# Patient Record
Sex: Male | Born: 1945 | Hispanic: Yes | Marital: Married | Smoking: Former smoker
Health system: Southern US, Community
[De-identification: ages and names within clinical notes are randomized; demographics above are authoritative.]

## PROBLEM LIST (undated history)

## (undated) DIAGNOSIS — I10 Essential (primary) hypertension: Secondary | ICD-10-CM

## (undated) DIAGNOSIS — I7101 Dissection of thoracic aorta: Secondary | ICD-10-CM

## (undated) DIAGNOSIS — I729 Aneurysm of unspecified site: Secondary | ICD-10-CM

## (undated) DIAGNOSIS — R079 Chest pain, unspecified: Secondary | ICD-10-CM

## (undated) DIAGNOSIS — G8929 Other chronic pain: Secondary | ICD-10-CM

## (undated) DIAGNOSIS — R0602 Shortness of breath: Secondary | ICD-10-CM

## (undated) DIAGNOSIS — M47817 Spondylosis without myelopathy or radiculopathy, lumbosacral region: Secondary | ICD-10-CM

## (undated) DIAGNOSIS — M549 Dorsalgia, unspecified: Secondary | ICD-10-CM

## (undated) HISTORY — DX: Spondylosis without myelopathy or radiculopathy, lumbosacral region: M47.817

## (undated) HISTORY — DX: Other chronic pain: G89.29

## (undated) HISTORY — DX: Dorsalgia, unspecified: M54.9

## (undated) HISTORY — DX: Shortness of breath: R06.02

## (undated) HISTORY — DX: Dissection of thoracic aorta: I71.01

## (undated) HISTORY — DX: Chest pain, unspecified: R07.9

## (undated) HISTORY — DX: Aneurysm of unspecified site: I72.9

## (undated) HISTORY — DX: Essential (primary) hypertension: I10

---

## 2007-06-10 DIAGNOSIS — I7101 Dissection of ascending aorta: Secondary | ICD-10-CM

## 2007-06-10 HISTORY — PX: OTHER SURGICAL HISTORY: SHX169

## 2007-06-10 HISTORY — DX: Dissection of ascending aorta: I71.010

## 2007-06-10 HISTORY — DX: Dissection of thoracic aorta: I71.01

## 2007-06-11 ENCOUNTER — Ambulatory Visit: Payer: Self-pay | Admitting: Thoracic Surgery (Cardiothoracic Vascular Surgery)

## 2007-06-11 ENCOUNTER — Encounter: Payer: Self-pay | Admitting: Thoracic Surgery (Cardiothoracic Vascular Surgery)

## 2007-06-11 ENCOUNTER — Inpatient Hospital Stay (HOSPITAL_COMMUNITY): Admission: EM | Admit: 2007-06-11 | Discharge: 2007-06-16 | Payer: Self-pay | Admitting: Emergency Medicine

## 2007-06-21 ENCOUNTER — Inpatient Hospital Stay (HOSPITAL_COMMUNITY): Admission: EM | Admit: 2007-06-21 | Discharge: 2007-06-26 | Payer: Self-pay | Admitting: Emergency Medicine

## 2007-06-21 ENCOUNTER — Ambulatory Visit: Payer: Self-pay | Admitting: Cardiovascular Disease

## 2007-06-22 ENCOUNTER — Encounter: Payer: Self-pay | Admitting: Cardiothoracic Surgery

## 2007-06-22 ENCOUNTER — Encounter: Payer: Self-pay | Admitting: Thoracic Surgery (Cardiothoracic Vascular Surgery)

## 2007-06-22 HISTORY — PX: SUBXYPHOID PERICARDIAL WINDOW: SHX5075

## 2007-07-09 ENCOUNTER — Encounter
Admission: RE | Admit: 2007-07-09 | Discharge: 2007-07-09 | Payer: Self-pay | Admitting: Thoracic Surgery (Cardiothoracic Vascular Surgery)

## 2007-07-09 ENCOUNTER — Ambulatory Visit: Payer: Self-pay | Admitting: Thoracic Surgery (Cardiothoracic Vascular Surgery)

## 2007-10-01 ENCOUNTER — Encounter
Admission: RE | Admit: 2007-10-01 | Discharge: 2007-10-01 | Payer: Self-pay | Admitting: Thoracic Surgery (Cardiothoracic Vascular Surgery)

## 2007-10-01 ENCOUNTER — Ambulatory Visit: Payer: Self-pay | Admitting: Thoracic Surgery (Cardiothoracic Vascular Surgery)

## 2008-01-30 ENCOUNTER — Encounter
Admission: RE | Admit: 2008-01-30 | Discharge: 2008-01-30 | Payer: Self-pay | Admitting: Thoracic Surgery (Cardiothoracic Vascular Surgery)

## 2008-02-04 ENCOUNTER — Ambulatory Visit: Payer: Self-pay | Admitting: Thoracic Surgery (Cardiothoracic Vascular Surgery)

## 2008-08-11 ENCOUNTER — Encounter
Admission: RE | Admit: 2008-08-11 | Discharge: 2008-08-11 | Payer: Self-pay | Admitting: Thoracic Surgery (Cardiothoracic Vascular Surgery)

## 2008-08-11 ENCOUNTER — Ambulatory Visit: Payer: Self-pay | Admitting: Thoracic Surgery (Cardiothoracic Vascular Surgery)

## 2009-02-18 ENCOUNTER — Encounter
Admission: RE | Admit: 2009-02-18 | Discharge: 2009-02-18 | Payer: Self-pay | Admitting: Thoracic Surgery (Cardiothoracic Vascular Surgery)

## 2009-02-18 DIAGNOSIS — I729 Aneurysm of unspecified site: Secondary | ICD-10-CM

## 2009-02-18 HISTORY — DX: Aneurysm of unspecified site: I72.9

## 2009-02-20 ENCOUNTER — Ambulatory Visit: Payer: Self-pay | Admitting: Thoracic Surgery (Cardiothoracic Vascular Surgery)

## 2009-03-24 DIAGNOSIS — I71012 Dissection of descending thoracic aorta: Secondary | ICD-10-CM

## 2009-03-24 DIAGNOSIS — I7101 Dissection of thoracic aorta: Secondary | ICD-10-CM

## 2009-03-24 HISTORY — DX: Dissection of descending thoracic aorta: I71.012

## 2009-03-24 HISTORY — DX: Dissection of thoracic aorta: I71.01

## 2009-03-25 ENCOUNTER — Ambulatory Visit: Payer: Self-pay | Admitting: Cardiology

## 2009-03-25 ENCOUNTER — Inpatient Hospital Stay (HOSPITAL_COMMUNITY): Admission: EM | Admit: 2009-03-25 | Discharge: 2009-04-04 | Payer: Self-pay | Admitting: Emergency Medicine

## 2009-03-25 ENCOUNTER — Ambulatory Visit: Payer: Self-pay | Admitting: Thoracic Surgery (Cardiothoracic Vascular Surgery)

## 2009-03-25 ENCOUNTER — Encounter: Payer: Self-pay | Admitting: Thoracic Surgery (Cardiothoracic Vascular Surgery)

## 2009-05-04 ENCOUNTER — Ambulatory Visit: Payer: Self-pay | Admitting: Thoracic Surgery (Cardiothoracic Vascular Surgery)

## 2009-05-04 ENCOUNTER — Encounter
Admission: RE | Admit: 2009-05-04 | Discharge: 2009-05-04 | Payer: Self-pay | Admitting: Thoracic Surgery (Cardiothoracic Vascular Surgery)

## 2009-08-10 ENCOUNTER — Ambulatory Visit: Payer: Self-pay | Admitting: Thoracic Surgery (Cardiothoracic Vascular Surgery)

## 2009-08-10 ENCOUNTER — Encounter
Admission: RE | Admit: 2009-08-10 | Discharge: 2009-08-10 | Payer: Self-pay | Admitting: Thoracic Surgery (Cardiothoracic Vascular Surgery)

## 2009-10-30 ENCOUNTER — Encounter
Admission: RE | Admit: 2009-10-30 | Discharge: 2010-01-28 | Payer: Self-pay | Source: Home / Self Care | Attending: Physical Medicine & Rehabilitation | Admitting: Physical Medicine & Rehabilitation

## 2009-11-05 ENCOUNTER — Ambulatory Visit: Payer: Self-pay | Admitting: Physical Medicine & Rehabilitation

## 2009-11-09 ENCOUNTER — Encounter
Admission: RE | Admit: 2009-11-09 | Discharge: 2009-11-09 | Payer: Self-pay | Admitting: Thoracic Surgery (Cardiothoracic Vascular Surgery)

## 2009-11-09 ENCOUNTER — Ambulatory Visit: Payer: Self-pay | Admitting: Thoracic Surgery (Cardiothoracic Vascular Surgery)

## 2009-12-10 ENCOUNTER — Ambulatory Visit: Payer: Self-pay | Admitting: Physical Medicine & Rehabilitation

## 2010-02-02 ENCOUNTER — Encounter
Admission: RE | Admit: 2010-02-02 | Discharge: 2010-03-02 | Payer: Self-pay | Source: Home / Self Care | Attending: Physical Medicine & Rehabilitation | Admitting: Physical Medicine & Rehabilitation

## 2010-02-08 ENCOUNTER — Ambulatory Visit
Admission: RE | Admit: 2010-02-08 | Discharge: 2010-02-08 | Payer: Self-pay | Source: Home / Self Care | Attending: Physical Medicine & Rehabilitation | Admitting: Physical Medicine & Rehabilitation

## 2010-02-15 ENCOUNTER — Ambulatory Visit
Admission: RE | Admit: 2010-02-15 | Discharge: 2010-02-15 | Payer: Self-pay | Source: Home / Self Care | Attending: Thoracic Surgery (Cardiothoracic Vascular Surgery) | Admitting: Thoracic Surgery (Cardiothoracic Vascular Surgery)

## 2010-02-15 ENCOUNTER — Encounter
Admission: RE | Admit: 2010-02-15 | Discharge: 2010-02-15 | Payer: Self-pay | Source: Home / Self Care | Attending: Thoracic Surgery (Cardiothoracic Vascular Surgery) | Admitting: Thoracic Surgery (Cardiothoracic Vascular Surgery)

## 2010-02-21 ENCOUNTER — Encounter: Payer: Self-pay | Admitting: Thoracic Surgery (Cardiothoracic Vascular Surgery)

## 2010-02-22 ENCOUNTER — Encounter: Payer: Self-pay | Admitting: Thoracic Surgery (Cardiothoracic Vascular Surgery)

## 2010-04-05 ENCOUNTER — Encounter: Payer: Self-pay | Admitting: Physical Medicine & Rehabilitation

## 2010-04-05 ENCOUNTER — Encounter: Payer: Worker's Compensation | Attending: Physical Medicine & Rehabilitation

## 2010-04-05 DIAGNOSIS — M545 Low back pain, unspecified: Secondary | ICD-10-CM | POA: Insufficient documentation

## 2010-04-12 ENCOUNTER — Encounter: Payer: Worker's Compensation | Admitting: Physical Medicine & Rehabilitation

## 2010-04-12 DIAGNOSIS — M5380 Other specified dorsopathies, site unspecified: Secondary | ICD-10-CM

## 2010-04-21 LAB — BASIC METABOLIC PANEL
CO2: 27 mEq/L (ref 19–32)
Calcium: 8.2 mg/dL — ABNORMAL LOW (ref 8.4–10.5)
Creatinine, Ser: 1.04 mg/dL (ref 0.4–1.5)
GFR calc Af Amer: >60 mL/min (ref >60)
GFR calc non Af Amer: >60 mL/min (ref >60)
Sodium: 137 mEq/L (ref 135–145)

## 2010-04-21 LAB — CARDIAC PANEL(CRET KIN+CKTOT+MB+TROPI)
CK, MB: 0.9 ng/mL (ref 0.3–4.0)
CK, MB: 1.2 ng/mL (ref 0.3–4.0)
Relative Index: INVALID (ref 0.0–2.5)
Relative Index: INVALID (ref 0.0–2.5)
Total CK: 103 U/L (ref 7–232)
Troponin I: 0.01 ng/mL (ref 0.00–0.06)
Troponin I: 0.02 ng/mL (ref 0.00–0.06)

## 2010-04-21 LAB — CBC
Hemoglobin: 11 g/dL — ABNORMAL LOW (ref 13.0–17.0)
MCHC: 34 g/dL (ref 30.0–36.0)
Platelets: 175 10*3/uL (ref 150–400)
RBC: 3.1 MIL/uL — ABNORMAL LOW (ref 4.22–5.81)
RDW: 14.9 % (ref 11.5–15.5)
WBC: 8.7 10*3/uL (ref 4.0–10.5)

## 2010-04-21 LAB — POCT I-STAT, CHEM 8
Glucose, Bld: 133 mg/dL — ABNORMAL HIGH (ref 70–99)
HCT: 43 % (ref 39.0–52.0)
Hemoglobin: 14.6 g/dL (ref 13.0–17.0)
Potassium: 4.4 mEq/L (ref 3.5–5.1)
Sodium: 139 mEq/L (ref 135–145)
TCO2: 22 mmol/L (ref 0–100)

## 2010-04-21 LAB — DIFFERENTIAL
Basophils Absolute: 0 10*3/uL (ref 0.0–0.1)
Basophils Relative: 0 % (ref 0–1)
Lymphocytes Relative: 10 % — ABNORMAL LOW (ref 12–46)
Neutro Abs: 9.6 10*3/uL — ABNORMAL HIGH (ref 1.7–7.7)

## 2010-04-21 LAB — TYPE AND SCREEN

## 2010-04-21 LAB — PROTIME-INR: Prothrombin Time: 13.8 seconds (ref 11.6–15.2)

## 2010-04-21 LAB — POCT CARDIAC MARKERS
CKMB, poc: <1 ng/mL — ABNORMAL LOW (ref 1.0–8.0)
Myoglobin, poc: 79.7 ng/mL (ref 12–200)

## 2010-04-21 LAB — GLUCOSE, CAPILLARY
Glucose-Capillary: 112 mg/dL — ABNORMAL HIGH (ref 70–99)
Glucose-Capillary: 116 mg/dL — ABNORMAL HIGH (ref 70–99)
Glucose-Capillary: 151 mg/dL — ABNORMAL HIGH (ref 70–99)

## 2010-04-21 LAB — MRSA PCR SCREENING: MRSA by PCR: NEGATIVE

## 2010-04-25 LAB — CBC
Hemoglobin: 12.1 g/dL — ABNORMAL LOW (ref 13.0–17.0)
MCHC: 33.9 g/dL (ref 30.0–36.0)
Platelets: 192 10*3/uL (ref 150–400)
RDW: 15.1 % (ref 11.5–15.5)

## 2010-04-25 LAB — BASIC METABOLIC PANEL
BUN: 10 mg/dL (ref 6–23)
CO2: 28 mEq/L (ref 19–32)
Calcium: 8.8 mg/dL (ref 8.4–10.5)
GFR calc non Af Amer: >60 mL/min (ref >60)
Glucose, Bld: 113 mg/dL — ABNORMAL HIGH (ref 70–99)
Sodium: 134 mEq/L — ABNORMAL LOW (ref 135–145)

## 2010-05-13 ENCOUNTER — Encounter: Payer: Worker's Compensation | Attending: Physical Medicine & Rehabilitation

## 2010-05-13 ENCOUNTER — Ambulatory Visit: Payer: Worker's Compensation | Admitting: Physical Medicine & Rehabilitation

## 2010-05-13 DIAGNOSIS — M719 Bursopathy, unspecified: Secondary | ICD-10-CM

## 2010-05-13 DIAGNOSIS — I729 Aneurysm of unspecified site: Secondary | ICD-10-CM

## 2010-05-13 DIAGNOSIS — M67919 Unspecified disorder of synovium and tendon, unspecified shoulder: Secondary | ICD-10-CM

## 2010-05-13 DIAGNOSIS — M47817 Spondylosis without myelopathy or radiculopathy, lumbosacral region: Secondary | ICD-10-CM

## 2010-05-13 DIAGNOSIS — M545 Low back pain, unspecified: Secondary | ICD-10-CM | POA: Insufficient documentation

## 2010-05-13 DIAGNOSIS — Q762 Congenital spondylolisthesis: Secondary | ICD-10-CM

## 2010-05-27 NOTE — Assessment & Plan Note (Signed)
[name] returns today.  CHIEF COMPLAINT:  Back pain.  He also has some pain in the lateral thighs bilaterally with some numbness, usually on the right but now also involving the left.  He underwent bilateral L5 dorsal ramus injections, bilateral L4 and bilateral L3 medial branch blocks under fluoroscopic guidance on April 12, 2010.  He went from 8/10 to 2/10 pain giving him at least 75% relief.  This pain has crept back to about a 7/10.  The last injection lasted about 3 weeks.  The previous one also lasted a couple of weeks. Also less in terms of weeks but now months.  His Oswestry score is 80%, stating he cannot lift or carry anything, although this is also from restrictions placed by his thoracic surgeon. He can only walk with crutches, cane.  His sitting tolerance is 10 minutes, standing tolerance is 10 minutes.  The pain interferes with light duties as well as homemaking chores.  The patient also has bilateral rotator cuff repairs.  REVIEW OF SYSTEMS:  Positive for numbness, trouble walking, spasms, anxiety, blood sugar regulation problems, diarrhea, limb swelling, shortness of breath.  SOCIAL HISTORY:  Lives with his wife.  Nonsmoker and nondrinker.  PHYSICAL EXAMINATION:  VITAL SIGNS:  Blood pressure 124/50, pulse 66, respirations 18, and O2 saturation 97% on room air. MUSCULOSKELETAL:  Bilateral upper extremities have abduction to 90 degrees forward flexion and abduction.  His motor strength in the upper extremity is 3-/5 at deltoid, otherwise 4/5 biceps, triceps, grip.  In the lower extremity, he is 4/5 hip flexion/extension, ankle dorsiflexor, and limited by lower extremity pain.  His sensation is intact to light touch.  Straight leg raising test is negative.  In terms of radicular pain, he does have some pain in the back area.  His hip, knee, and ankle range of motion is good.  IMPRESSION:  Lumbar spondylolisthesis.  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  He  has had good relief x2 short term with medial branch blocks.  In think he would be a candidate for radiofrequency starting on the right side L3 and L4 medial branch and L5 dorsal ramus and one month later we will do on left side assuming good persistent improvement on the right side.  The patient gave me some paper work to complete in regards to his workers' comp, C4 progress report.  I will see him back for the procedure.     [name] [name], M.D. Electronically Signed    AEK/MedQ D:  05/13/2010 15:15:17  T:  05/14/2010 01:03:23  Job #:  [id number]

## 2010-06-15 NOTE — Assessment & Plan Note (Signed)
OFFICE VISIT   [name] [name], SCHEPERS  DOB:  [birth year]                                        May 04, 2009  CHART #:  [id number]   HISTORY OF PRESENT ILLNESS:  The patient returns for followup of his  aortic dissection.  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  A followup CT angiogram performed in January of this  year revealed new small areas along the proximal aortic root consistent  with small false aneurysms at the site of the patient's original  proximal anastomosis.  At that time, the patient was smoking and his  blood pressure was not under good control.  On March 24, 2009, the  patient developed new acute type B aortic dissection.  He was  hospitalized for approximately 10 days and treated with blood pressure  control that initially required invasive monitoring.  He stabilized and  ultimately was discharged home.  He returns to our office today for  further followup.  He reports that since hospital discharge, he has  continued to do fairly well.  He still has intermittent episodes of pain  across his back and shoulders and chest that seems potentially to be  related to his aortic dissection.  He has obtained a blood pressure  monitoring system at home and has been keeping close check on his blood  pressure at least 3 times every day.  He states that his blood pressure  typically measures less than 110 systolic, occasionally getting as high  as 120 or 125 at the most.  He has done well with this.  He has also  continued to abstain from any tobacco use.  He states that he feels  tired, but otherwise is doing well.  He has not had any shortness of  breath.  The remainder of his review of systems is unremarkable.  The  remainder of his past medical history is unchanged.   CURRENT MEDICATIONS:  1. Labetalol 800 mg p.o. twice daily.  2. Carisoprodol 350 mg  daily at bedtime.  3. Norvasc 10 mg twice daily.  4. Colace 200 mg daily.  5. Ultram as needed for pain.  6. Oxycodone as needed for severe pain.   PHYSICAL EXAMINATION:  Notable for well-appearing male with blood  pressure 114/64, pulse 66, and oxygen saturation 95% on room air.  Auscultation of the chest demonstrates clear breath sounds which are  symmetrical bilaterally.  No wheezes, rales, or rhonchi noted.  Cardiovascular exam is notable for regular rate and rhythm.  There is a  grade 2-3/6 systolic murmur heard best at the right upper sternal  border.  No diastolic murmurs are noted.  The abdomen is soft,  nondistended, and nontender.  Bowel sounds are present.  The extremities  are warm and well perfused.  Pulses are palpable in both radial artery  positions and both posterior tibial positions.  Rectal and GU exams are  both deferred.  Neurologic examination is grossly nonfocal and  symmetrical throughout.   DIAGNOSTIC TESTS:  CT angiogram of the chest performed earlier today at  [facility] is reviewed.  This demonstrates stable radiographic  appearance of the entire thoracic aorta.  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  There remains type B dissection.  The thoracic aorta  has not increased significantly in size.  There is a small amount of  mural thrombus in the wall of the descending thoracic aorta, which was  not present previously.  No other abnormalities are noted.   IMPRESSION:  The patient appears to be doing much better with respect to  blood pressure control and daily habits.  He continues to abstain from  tobacco abuse.  His thoracic aorta appears radiographically stable.  He  is still having some atypical chest pain, which could be related to his  aorta.   PLAN:  I have reminded the patient to avoid any sort of heavy lifting or  strenuous exertion whatsoever.  He should  avoid straining.  I do think  walking is good and I have encouraged him to continue to remain active  from that standpoint.  He understands how important it is for him to  continue to abstain from any tobacco use, and he also understands how  important it is for him to watch his diet, to avoid salt, and to keep  very close management of his blood pressure.  All of his questions have  been addressed.   We will see the patient back in 3 months with a repeat CT angiogram.   [name], M.D.  Electronically Signed   CHO/MEDQ  D:  05/04/2009  T:  05/05/2009  Job:  [id number]   cc:   Duanne Moron, M.D.

## 2010-06-15 NOTE — Assessment & Plan Note (Signed)
OFFICE VISIT   [name] [name], [name] [name]  DOB:  11-Jul-1945                                        October 01, 2007  CHART #:  [id number]   HISTORY OF PRESENT ILLNESS:  The patient returns for followup and  routine surveillance status post emergency hemiarch repair of acute type  A aortic dissection with resuspension of his native aortic valve on Jun 10, 2007.  He was last seen here in the office on July 09, 2007.  Since  then the patient has done well.  He continues to follow up with Dr.  Bing Matter at [facility] in [place] for management of  hypertension.  He apparently underwent a 2D echocardiogram at their  office and was told that the results were favorable.  We have not seen a  report from that exam yet.  The patient reports that overall he is doing  well from the standpoint of his previous aortic dissection.  He is still  having problems with anxiety.  He and his wife have decided they feel  that his aortic dissection must certainly have developed as a result of  problems related to an on-the-job injuries he sustained in the distant  past.  He has problems with chronic pain in his shoulders and apparently  is contemplating returning to see Dr. Chestine Spore for possible surgical  intervention.  His appetite is good.  His activity level is reasonably  good.  He has very minimal residual soreness in the anterior chest.   REVIEW OF SYSTEMS:  The remainder of his review of systems is  noncontributory.   PAST MEDICAL HISTORY:  The remainder of his past medical history is  unchanged.   CURRENT MEDICATIONS:  Carisoprodol 350 mg p.o. 4 times daily, Ultram as  needed for pain, Advil as needed for pain, Timoptic and Travatan  eyedrops, calcium, vitamin D supplement, and Lexapro.   PHYSICAL EXAMINATION:  GENERAL:  Notable for a well-appearing male.  VITAL SIGNS:  Blood pressure 157/98, pulse 78, and oxygen saturation  96%.  HEENT:  Unrevealing.  CHEST:  The median  sternotomy incision has healed completely and the  sternum is stable on palpation.  Auscultation of the chest reveals clear  breath sounds, which are symmetrical bilaterally.  CARDIOVASCULAR:  Regular rate and rhythm.  No murmurs, rubs, or gallops  are noted.  ABDOMEN:  Soft and nontender.  EXTREMITIES:  Warm and well perfused.  Pulses are palpable.   DIAGNOSTIC TESTS:  CT angiogram performed today at the [facility] is reviewed.  This demonstrates stable radiographic  appearance of the thoracic aorta, status post repair of previous acute  aortic dissection.  The descending thoracic aorta has healed completely  it would appear and there is no residual false lumen.  No other  abnormalities are of any significance are appreciated, although the  final report from the interpreting radiologist is pending at this time.   IMPRESSION:  The patient is doing quite well with respect to his  recovery from his acute aortic dissection.  His CT angiogram performed  today looks good and it appears as though the aorta has healed nicely.  His blood pressure is somewhat elevated, although he is somewhat nervous  about his visit in the office today.   PLAN:  I had counseled the patient how important it is for  him to keep  very close management of his hypertension.  He is concerned about making  a trip to [place].  At this point, there is no reason with respect to  his previous surgery that he could not make the trip.  He is also asked  about possible orthopedic surgery for his shoulders.  I suspect that  within the next few months, he could certainly undergo arthroscopic  procedure if needed and even an open procedure would probably be  reasonable as long as his blood pressure is carefully managed.  All of  his questions have been addressed.   PLAN:  We will have the patient return in 3 months for a followup CT  angiogram for routine surveillance.   [name], M.D.   Electronically Signed   CHO/MEDQ  D:  10/01/2007  T:  10/02/2007  Job:  [id number]   cc:   Gypsy Balsam

## 2010-06-15 NOTE — Op Note (Signed)
[name] [name], [name] [name]                  ACCOUNT NO.:  [id number]   MEDICAL RECORD NO.:  [id number]          PATIENT TYPE:  INP   LOCATION:  2310                         FACILITY:  [facility]   PHYSICIAN:  [name], M.D. DATE OF BIRTH:  1945-04-03   DATE OF PROCEDURE:  06/22/2007  DATE OF DISCHARGE:                               OPERATIVE REPORT   PREOPERATIVE DIAGNOSIS:  Pericardial effusion status post repair of  acute aortic dissection.   POSTOPERATIVE DIAGNOSIS:  Pericardial effusion status post repair of  acute aortic dissection.   PROCEDURE:  Subxiphoid pericardial window.   SURGEON:  [name], MD   ASSISTANT:  Theda Belfast, PA   ANESTHESIA:  General.   BRIEF CLINICAL NOTE:  The patient is a 65 year old male who underwent  emergent repair of acute type A aortic dissection 10 days previously.  The patient is readmitted to the hospital with mild dizziness and  confusion.  Chest CT scan reveals normal arch aortogram of the thoracic  aorta, but it also reveals a moderate-to-large-sized pericardial  effusion.  A 2-D echocardiogram confirms the presence of a large  pericardial effusion.  The patient is counseled regarding the  indications, risks, and potential benefits of surgical intervention for  drainage of this pericardial effusion.  Alternative treatment strategies  have been discussed.  He understands and accepts all associated risks  and desires to proceed as described.   OPERATIVE NOTE IN DETAIL:  The patient was brought to the operating room  on the above-mentioned date and placed in the supine position on the  operating table.  Intravenous antibiotics were administered.  Central  venous catheter and radial arterial line were placed for monitoring  purposes.  General endotracheal anesthesia was induced uneventfully  under the care and direction of Dr. Claybon Jabs.  A Foley catheter was  placed.  Baseline transesophageal echocardiogram was performed.   This  demonstrated a large free-flowing pericardial effusion.  There was  normal left ventricular function.  The aortic valve appeared normal.   The patient's chest, abdomen, both groins were prepared and draped in  sterile manner.  The midline incision was made beginning over the  xiphoid process along the previous sternal incision and extending  inferiorly towards the umbilicus over several centimeters.  The incision  was completed through the subcutaneous tissues and the linea alba with  electrocautery.  The left body of the rectus abdominis muscle was  retracted anteriorly and cephalad.  The preperitoneal fascia was  retracted inferiorly.  The undersurface of the pericardium was exposed.  The pericardium was incised with electrocautery.  A total of 500 mL of  old dark, thin, bloody fluid was evacuated from the pericardial sac.  A  specimen of the fluid was sent for routine culture and sensitivity.  The  patient's blood pressure was noted to increase 50 mmHg after evacuation  of the effusion.Transesophageal echocardiogram verifies complete  evacuation of the effusion.  A 28-French Blake drain was exited through  separate stab incision and placed through the incision in the  pericardium to drain the pericardial space.  Meticulous hemostasis was  ascertained.  The abdominal fascia was closed in the midline using  interrupted #1 Vicryl sutures.  The subcutaneous tissues were  closed in layers, and the skin was closed with a subcuticular skin  closure.  The patient tolerated the procedure well, was extubated in the  operating room, and transported to recovery room in stable condition.  There were no intraoperative complications.      [name], M.D.  Electronically Signed     CHO/MEDQ  D:  06/22/2007  T:  06/23/2007  Job:  [id number]

## 2010-06-15 NOTE — Consult Note (Signed)
[name] [name], [name] [name]                  ACCOUNT NO.:  [id number]   MEDICAL RECORD NO.:  [id number]          PATIENT TYPE:  INP   LOCATION:  2028                         FACILITY:  [facility]   PHYSICIAN:  Christell Faith, MD   DATE OF BIRTH:  06/17/1945   DATE OF CONSULTATION:  DATE OF DISCHARGE:                                 CONSULTATION   [facility] will be consulting for pericardial effusion.   REFERRING PHYSICIAN:  [name], M.D.   CHIEF COMPLAINT:  Confusion.  Dizziness   HISTORY OF PRESENT ILLNESS:  This is a 65 year old Latino man who is  status post acute type-A aortic dissection repair 10 days ago by Dr.  Cornelius Moras.  He was discharged last week and has been recovering well at home.  However, the patient's sons and wife are concerned because he is having  spells of confusion and is not sleeping well at night for tyhe past few  nights.  The patient states he is not sleeping well because he is used  to lying face down on his chest, but can not do that because of his  recent sternotomy, so he is having trouble getting comfortable in other  positions.  He denies orthopnea or PND.  He admits to very mild dyspnea  with exertion.  He is walking regularly throughout the day without chest  pain or undue dyspnea.  He reports occasional dizziness with standing,  which resolves after standing for a few moments.  He has had no syncope.   PAST MEDICAL HISTORY:  1. On Jun 10, 2007, he presented to the emergency department with      chest pain, diaphoresis, and syncope, was found to have a type A-      aortic dissection complicated by hemopericardium and cardiac      tamponade.  He was taken to the emergency room by Dr. Cornelius Moras, had a      hemiarch repair of the aorta from the sinotubular junction to the      arch at a position just beyond the ligamentum arteriosum.  The      tamponade was relieved with opening of the pericardium.  The      dissection involved the noncoronary sinus of  Valsalva and a small      area around the left main coronary artery.  2. The native aortic valve was resuspended with resolution of aortic      insufficiency after aortic repair.  3. Postop confusion, resolved prior to discharge.  4. Postop anemia, status post multiple transfusions.  5. Postoperative hypertension, severe.  6. Chronic hypertension.  7. Significant degenerative spinal disease.  8. Status post appendectomy.  9. 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   SOCIAL HISTORY:  He lives in [place] with his wife.  He has two sons  who live nearby.  He smoked half-a-pack to pack-a-day prior to his  surgery, but has quit since then.  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   FAMILY HISTORY:  Noncontributory.   ALLERGIES:  None.   MEDICATIONS:  1. Lopressor 25 mg b.i.d.  2. Ultram q. 6h.  3. Vicodin p.r.n.   REVIEW OF SYSTEMS:  Positive for hoarse voice status post endotracheal  intubation, positive for mild chest pain, mild dyspnea on exertion, and  dizziness upon standing, positive for weakness and anxiety.  Otherwise  the balance of 14 systems is reviewed and is negative.   PHYSICAL EXAMINATION:  VITAL SIGNS: Temperature 98.5, pulse 89,  saturation 97% on room air, blood pressure 93/66 while seated, pulsus  paradoxus in the left arm is 16.  The first Korotkoff sound is present  with his systolic pressure of 92 at expiration only and Korotkoff sounds  are present with both inspiration and expiration at a systolic pressure  of 76.  GENERAL: This is a pleasant Latino man in no acute distress.  He is calm  and generally appears well.  HEENT: Pupils equal round and reactive.  Sclerae are clear.  NECK: Supple.  He has a well-healed scar in his right lateral neck,  status post tumor resection.  No JVD.  No carotid bruits.  LUNGS: Clear to auscultation bilaterally  without wheezing or rales.  CARDIAC: Reveals normal rate, regular rhythm.  Heart tones are distant.  There is no rub.  There is no obvious murmur.  ABDOMEN: Soft, nontender,  nondistended.  EXTREMITIES: Revealed 1+ edema bilaterally with intact pulses.  SKIN: There is no skin rash.  The patient is awake, alert and oriented  x3.  He is not overtly confused or delirious on exam.   DIAGNOSTIC TESTS:  Chest CT scan shows a large pericardial effusion  which is increased in size from the last CT scan on Jun 11, 2007; there  is no evidence of recurrent dissection.  EKG shows sinus rhythm with a  rate of 89 with nonspecific ST and T-wave changes and diffusely  decreased T wave voltage compared to prior.  Hemoglobin 9.4, hematocrit  28.3, sodium 135, potassium 4.7, BUN 14, and creatinine 1.   IMPRESSION:  This is a 65 year old Latino man who is postoperative day  #10 status post type A aortic dissection repair.  The dissection was  complicated by hemopericardium and tamponade which was relieved  intraoperatively by opening of the pericardium.  He now presents with a  vague complaints including intermittent episodes of confusion and mild  dizziness upon standing, and he is found to have increased pericardial  effusion on chest CT scan.   PLAN:  I do think this is pericardial fluid as opposed to fat and there  is evidence of increased pericardial pressure with a pulses paradoxus of  16.  Full echocardiogram was offered tonight, but CT surgery team is  comfortable with a echo first thing in the morning.  The patient clearly  looks comfortable at this time.  Would monitor on telemetry with close  attention to heart rate and blood pressure throughout the night.  Would  keep the patient on strict bedrest and discontinue his beta-blocker and  consider gentle hydration.  We will follow up with result of the  echocardiogram first thing in the morning and appreciate the  consultation.      Christell Faith, MD  Electronically Signed     NDL/MEDQ  D:  06/21/2007  T:  06/22/2007  Job:  [id number]

## 2010-06-15 NOTE — Assessment & Plan Note (Signed)
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  He was last seen here in the office on October 01, 2007.  Since then, he has done well.  The patient reports now being essentially  back to his baseline medical status, although he continues to have  problems with chronic bilateral shoulder pain and back pain that he  states dates back to an on-the-job injury that he sustained in the  distant past.  He is contemplating having elective left shoulder surgery  in the near future to further address rotator cuff injury related to  this incident.  He otherwise is doing well.  He states that his blood  pressure has been under good control.  He has not had any chest pain or  chest tightness of significance.  He has not had significant shortness  of breath.  His activity level is back to his baseline.  He has not had  any back pain or chest pain similar to that which occurred at the time  of his presentation last May.  The remainder of his review of systems is  unremarkable.  The remainder of his past medical history is unchanged.   CURRENT MEDICATIONS:  Carisoprodol 350 mg 4 times daily, Ultram tablets  as needed for pain, Advil tablets as needed for pain, Timoptic and  Travatan eye drops, Nexium, vitamin D with calcium supplement, and  Lexapro.   PHYSICAL EXAMINATION:  GENERAL:  Notable for a well-appearing male.  VITAL SIGNS:  Blood pressure 137/94, pulse 90, and oxygen saturation 94%  on room air.  CHEST:  Median sternotomy scar and a right subclavicular scar that have  both healed uneventfully.  The sternum is stable on palpation and  nontender.  Auscultation of the  chest demonstrates clear breath sounds,  which are symmetrical bilaterally.  CARDIOVASCULAR:  Regular rate and  rhythm.  There is a soft grade 2/6 systolic murmur heard best at the  right lower sternal border.  No diastolic murmurs are noted.  ABDOMEN:  Soft, nontender.  EXTREMITIES:  Warm and well perfused.  Pulses are palpable.  The remainder of his physical exam is unremarkable.   DIAGNOSTIC TEST:  CT angiogram of the thoracic and abdominal aorta  performed on January 30, 2008, is reviewed.  This demonstrates stable  radiographic appearance of the thoracic and abdominal aorta.  The  patient has had previous repair of the acute aortic dissection that  involved the ascending thoracic aorta.  The size of the transverse  aortic arch remains stable.  The descending thoracic aorta is normal in  appearance.  No other abnormalities of any significance are noted.   IMPRESSION:  The patient is doing well now more than 6 months following  emergency repair of acute aortic dissection.  His wife does report that  he has gone back to smoking cigarettes.  His blood pressure has been  under reasonably good control, although there may still be some slight  room for improvement.  PLAN:  I have strongly encouraged the patient to quit smoking.  We have  not made any suggestions for changes to his antihypertensive  medications, and we will leave this to Dr. Bing Matter and colleagues for  long-term management.  I do think it would be reasonable for the patient  to go ahead and have his rotator cuff surgery at any point if this is  felt to be clinically indicated.  There are no contraindications to  surgery at this time.  We will plan to see the patient back in 6 months  with another followup CT angiogram.   [name], M.D.  Electronically Signed   CHO/MEDQ  D:  02/04/2008  T:  02/04/2008  Job:  [id number]   cc:   Gypsy Balsam

## 2010-06-15 NOTE — Assessment & Plan Note (Signed)
OFFICE VISIT   [name] [name], [name] [name]  DOB:  02/05/1945                                        February 20, 2009  CHART #:  [id number]   HISTORY OF PRESENT ILLNESS:  The patient returns to the office today for  followup and surveillance status post emergency hemiarch repair of acute  type A aortic dissection with resuspension of his native aortic valve on  Jun 10, 2007.  He was last seen here in the office on August 11, 2008.  He  actually missed several followup appointments over the last 6 weeks, but  he ultimately returned to have his followup CT angiogram earlier this  week.  During the interim period of time, the patient apparently has  also stopped going to see Dr. Bing Matter for long-term attention to  management of his hypertension.  He now no longer has primary care  physician, although he [facility] St[facility] for attention to  his long-term issues related to chronic pain in his back, neck, and  shoulders related to an injury he sustained several years ago.  He has  not been taking his blood pressure medications regularly.  He continues  to smoke.  According to his family, he has gone back into weightlifting  despite the fact that he had been advised not to by me previously.  He  is not walking and exercising aerobically.  He has had some atypical  pains across his chest and shoulders.  Since I last saw him, he has also  had both shoulders operated on for rotator cuff injuries.  He denies any  persistent ongoing chest pain, worrisome for the possibility of pain  related to his aorta, although he does have some intermittent atypical  chest pains and pain in his left shoulder that seems to be more  musculoskeletal in origin.  It is difficult to sort out.  He has not had  any shortness of breath.  He has not had any PND or orthopnea.  He has  not had any exertional chest pain.  The remainder of his review of  systems is unremarkable.  The remainder of his  past medical history is  unchanged.   CURRENT MEDICATIONS:  1. Carisoprodol 350 mg daily at bedtime.  2. Hydrochlorothiazide 25 mg daily (which the patient admits he does      not take regularly).  3. Metoprolol 25 mg daily (which has not been taken recently).  4. Tramadol as needed for pain.  5. Tizanidine 4 mg 2 tablets at bedtime.  6. Prevacid 15 mg daily.   PHYSICAL EXAMINATION:  Notable for a mildly obese Hispanic male who  appears his stated age in no acute distress.  Blood pressure is 157/88,  pulse 76, and oxygen saturation 97% on room air.  Auscultation of the  chest reveals clear breath sounds, which are symmetrical bilaterally.  No wheezes or rhonchi are noted.  Cardiovascular exam is notable for  regular rate and rhythm.  No murmurs, rubs, or gallops are noted.  The  abdomen is soft and nontender.  The extremities are warm and well  perfused.  Pulses are palpable.   DIAGNOSTIC TESTS:  CT angiogram of the chest and abdomen performed  February 18, 2009 is reviewed.  Since the last previous exam, there has  been some interval change proximally.  There is no sign of any new  aortic dissection, but there are 2 or 3 small outpouchings located near  the aortic root that could represent small false aneurysms at the site  of the proximal anastomoses between the aortic root and the aortic  graft.  Both are small, considerably less than 2 cm in diameter.  There  is no sign of progression of aortic dissection distally in the arch  vessels, all remains stable.   IMPRESSION:  I am very concerned that the patient now has 2 small areas  at the proximal aortic root that could represent small false aneurysms  at the side of the proximal anastomosis related to his previous repair  of his acute aortic dissection.  His blood pressure is not under good  control and the patient is doing a very poor job with medical therapy.  He apparently quit going to see Dr. Bing Matter for unclear reasons.   He  continues to smoke.   PLAN:  I had a very blunt conversation with the patient and his family  here in the office.  I explained to them that I am very concerned about  this recent change in the appearance of his thoracic aorta.  There are  no indications for surgery at this time, but he will need to be watched  carefully.  Most importantly, he needs to absolutely quit smoking and  find a way to get his blood pressure under consistent control.  I  explained to him that long-term survival even after a successful repair  of acute type A aortic dissection is not normal by any stretch of the  imagination.  I have reminded him that he should avoid any heavy lifting  or straining that could increase his intrathoracic pressure.  Weightlifting is out of the question.  Smoking is out of the question.  Checking his blood pressure on a daily basis should be part of his  lifelong routine.  I have instructed him to keep a log of his blood  pressure every morning when he gets up and we will contact Dr.  Vanetta Shawl office to try to schedule a followup appointment with him in  the near future to reassess his antihypertensive medications.  We will  also have a followup echo done to make sure that he has not developed  aortic insufficiency given the recent changes on his CT angiogram.  However, he does not have any symptoms of heart failure and I do not  hear a murmur on exam.  We will have the patient return in 3 months for  repeat CT angiogram.   [name], M.D.  Electronically Signed   CHO/MEDQ  D:  02/20/2009  T:  02/21/2009  Job:  [id number]   cc:   Gypsy Balsam

## 2010-06-15 NOTE — H&P (Signed)
[name] [name], [name] [name]                  ACCOUNT NO.:  [id number]   MEDICAL RECORD NO.:  [id number]          PATIENT TYPE:  INP   LOCATION:  2550                         FACILITY:  [facility]   PHYSICIAN:  [name], M.D. DATE OF BIRTH:  [birth year]   DATE OF ADMISSION:  06/10/2007  DATE OF DISCHARGE:                              HISTORY & PHYSICAL   PRESENTING CHIEF COMPLAINT:  Chest pain.   HISTORY OF PRESENT ILLNESS:  The patient is a 65 year old male with  history of hypertension as well as chronic pain related to degenerative  disk disease of the spine and bilateral shoulder pain.  The patient  states that he was in his usual state of health until approximately 2  days ago when he began to develop substernal chest pressure.  This pain  was persistent and unremitting, although initially only mild in  character.  Earlier today, the patient developed sudden onset of much  more severe pain associated with diaphoresis and dizziness.  The patient  presented to the emergency room where he also suffered a brief syncopal  episode.  CT angiogram of the thoracic aorta demonstrates type A aortic  dissection.   PAST MEDICAL HISTORY:  1. Hypertension.  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  3. Chronic pain.  4. Bilateral shoulder pain.   PAST SURGICAL HISTORY:  Appendectomy.   FAMILY HISTORY:  Noncontributory.   SOCIAL HISTORY:  The patient lives in [place] with his wife, and he is  accompanied to the emergency room by his wife and family.  He smokes  about one-half pack of cigarettes per day.  He denies alcohol  consumption.   MEDICATIONS PRIOR TO ADMISSION:  Ultram as needed for pain and some type  of muscle relaxant.   DRUG ALLERGIES:  None known.   PHYSICAL EXAMINATION:  The patient is moderately obese Hispanic male,  who appears his stated age and is very panicky, in acute distress.  Blood pressure is 100/50, pulse 105, and sinus rhythm.  HEENT EXAM:  Grossly unremarkable.  The patient is edentulous.  His head,  neck, and upper extremities are dusky and cyanotic in appearance.  There  is no jugular venous distention.  No carotid bruits are noted.  Auscultation of the chest reveals clear breath sounds anteriorly, which  are symmetrical.  CARDIOVASCULAR EXAM: Reveals regular rate and rhythm.  No murmurs, rubs,  or gallops are noted.  ABDOMEN: Moderately obese, soft, nondistended, and nontender.  EXTREMITIES: Warm and adequately perfused.  Pulses are palpable in the  radial position in both arms and both femoral pulses.  RECTAL AND GU: Exams are both deferred.  NEUROLOGIC EXAMINATION: Nonfocal.  The patient is alert and oriented,  although in some distress.  SKIN: Clean, dry, and healthy appearing throughout.   DIAGNOSTIC TESTS:  CT angiogram of the thoracic aorta demonstrates type  A aortic dissection with hemopericardium.  The aortic dissection  involves the entire aortic root and the ascending thoracic aorta and  appears to stop in the middle of the transverse arch.  The patient has  bovine  arch anatomy.  There are no pleural effusions.  No other  significant abnormalities are appreciated.   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   PLAN:  Emergent surgical repair.  I have discussed matters briefly with  [name], his wife, and son, here in the emergency department.  The  patient's wife is Jehov[employer]'s Witness, but the patient explicitly accept  the notion that he may need transfusion of blood products during the  following surgery.  They understand the eminently life-threatening  nature of this problem without surgical intervention and desire that we  proceed directly to the operating room as planned.      [name], M.D.  Electronically Signed     CHO/MEDQ  D:  06/11/2007  T:  06/11/2007  Job:  [id number]

## 2010-06-15 NOTE — Assessment & Plan Note (Signed)
OFFICE VISIT   [name], [name]  DOB:  1945/02/03                                        July 09, 2007  CHART #:  [id number]   HISTORY OF PRESENT ILLNESS:  [name] returns for followup status post  emergency repair of acute type A aortic dissection.  [name] presented  acutely to the emergency department on Jun 10, 2007.  He underwent  emergency median sternotomy for hemiarch repair of acute type A aortic  dissection with resuspension of his native aortic valve.  His initial  postoperative recovery was remarkably uneventful and he was discharged  home.  He was readmitted to the hospital a week later with shortness of  breath and dizzy spells.  He was found to have a moderate to large size  free-flowing pericardial effusion without tamponade.  He subsequently  underwent subxiphoid pericardial window for drainage of his pericardial  effusion.  He tolerated this well and was again discharged home  uneventfully.  He now returns to the office for further followup.  At  this point, [name] reports that he is doing well, although he still  has his days and nights mixed up in terms of sleeping.  He has had some  anxiety about going to sleep at night, and he has been up most of the  nights with his wife.  He then sleeps most of the day while his wife is  up and around.  He otherwise has no specific complaints.  He has  moderate residual soreness in his chest that continues to gradually  improve.  He has had mild exertional shortness of breath that is stable  and improving.  His appetite is good.  His energy level is good.  He  denies any dizzy spells.  He denies any fevers, chills, or cough.  The  remainder of his review of systems is unremarkable.  Medications remain  unchanged from the time of hospital discharge.   PHYSICAL EXAMINATION:  GENERAL:  Notable for a well-appearing Hispanic  male with blood pressure 110/73, pulse 88 and regular, and oxygen  saturation  94% on room air.  CHEST:  Reveals a median sternotomy incision that is healing nicely.  The small incision in the right deltopectoral groove from axillary  artery cannulation is also healing well.  Auscultation of the chest  demonstrates clear breath sounds which are symmetrical.  No wheezes or  rhonchi are noted.  CARDIOVASCULAR:  Reveals regular rate and rhythm.  There is a slight  systolic ejection murmur along the right sternal border.  No diastolic  murmurs are noted.  ABDOMEN:  Soft, nondistended, and nontender.  Bowel sounds are present.  EXTREMITIES:  Warm and well-perfused.  There is no lower extremity  edema.  Pulses are palpable.  The remainder of his physical exam is unrevealing.   DIAGNOSTIC TEST:  Chest x-ray obtained today at the [facility] is reviewed.  This demonstrates a very small trace left pleural  effusion.  The lung fields are otherwise clear.  There is no pulmonary  edema.  All the sternal wires appear intact.  No other abnormalities are  noted.   IMPRESSION:  Satisfactory progress following recent emergency repair of  acute type A aortic dissection subsequently complicated by postoperative  pericardial effusion that was drained using subxiphoid pericardial  window.  [name] appears to be progressing quite nicely at this point  in time.   PLAN:  I have encouraged [name] to continue to gradually increase his  physical activity with his limitations primarily at this point remaining  that he refrain from heavy lifting or straining or any strenuous use of  his arms or shoulders.  I have encouraged him to increase how much he is  walking, and suggested that he could use stationary bike or treadmill  for increased aerobic physical activity.  We will refer him to [facility], Dr. Gypsy Balsam, for a new patient evaluation, as he  will need a cardiologist for indefinite followup in the future.  I would  favor obtaining a baseline 2D  echocardiogram in 2-3 months.  We will  plan to see [name] back in 3 months' time with a followup CT  angiogram of his thoracic aorta.  All of his questions have been  addressed.   [name], M.D.  Electronically Signed   CHO/MEDQ  D:  07/09/2007  T:  07/10/2007  Job:  [id number]   cc:   Gypsy Balsam

## 2010-06-15 NOTE — Discharge Summary (Signed)
[name] [name], [name] [name]                  ACCOUNT NO.:  [id number]   MEDICAL RECORD NO.:  [id number]          PATIENT TYPE:  INP   LOCATION:  2002                         FACILITY:  [facility]   PHYSICIAN:  [name], M.D. DATE OF BIRTH:  12-May-1945   DATE OF ADMISSION:  06/10/2007  DATE OF DISCHARGE:                               DISCHARGE SUMMARY   FINAL DIAGNOSIS:  Acute type A aortic dissection and pericardial  tamponade.   IN-HOSPITAL DIAGNOSES:  1. Hypertension.  2. Acute blood loss anemia.  3. Postop confusion.   SECONDARY DIAGNOSES:  1. Hypertension.  2. Degenerative disk disease of cervical and lumbar spine.  3. Chronic pain.  4. Bilateral shoulder pain.  5. Status post appendectomy.   IN-HOSPITAL OPERATIONS AND PROCEDURES:  Emergency median sternotomy for  hemi-arch repair of acute type A aortic dissection with resuspension of  aortic valve using right axillary artery, cannulation with total  circulatory arrest, and profound hypothermia.   HISTORY AND PHYSICAL AND HOSPITAL COURSE:  The patient is a 65 year old  Hispanic male with history of hypertension who presents with a 2-day  history of chest pain that progressed to sudden onset of severe  substernal chest pain on the evening of Jun 10, 2007.  The patient  presented to the emergency department right after he suffered a syncopal  episode.  Chest CT demonstrated acute type A aortic dissection with a  hemopericardium.  At the time of presentation, the patient remained  awake and alert, although severely anxious and diaphoretic.  Dr. Cornelius Moras  was consulted.  Dr. Cornelius Moras saw and evaluated the patient.  He discussed  with the patient regarding repair of type A aortic dissection.  He  discussed the risks and benefits with the patient.  The patient  acknowledged understanding and agreed to proceed.  The patient was taken  emergently to the operating room.  For further details of the patient's  past medical history and physical  exam, please see dictated H and P.   The patient was taken to the operating room emergently on Jun 10, 2007,  where he underwent emergency median sternotomy for hemi-arch repair of  acute type A aortic dissection with resuspension of aortic valve using  right axillary artery cannulation with total circulatory arrest with  profound hypothermia.  The patient tolerated this procedure and was  transferred to the intensive care unit.  Postoperatively, the patient  was noted to be hemodynamically stable.  He was able to be extubated  following surgery without difficulties.  Post-extubation, the patient  was noted to be alert and oriented x4, neuro intact.  He was placed on  nasal cannula at 6 L.  His sats were greater than 90%.  Followup chest x-  ray done on postop day #1 was clear with no significant effusions or  atelectasis.  The patient had minimal drainage from chest tubes and  chest tubes were discontinued in the normal fashion.  Repeat chest x-ray  done on postop day #2 remained stable.  The patient was encouraged to  use his incentive spirometer.  He was eventually  able to be weaned off  oxygen sating greater than 90% on room air.  Postoperatively, the  patient noted to have severe hypertension.  He was started on a SNP drip  as well as labetalol IV.  The patient's blood pressure did improve.  He  was weaned off SNP and switched to labetalol p.o.  The patient was  eventually switched from labetalol to Lopressor.  Blood pressure was  well maintained on p.o. Lopressor.  Heart rate remained stable.  He  remained in normal sinus rhythm postoperatively.  The patient did have  significant volume overload and was started on a Lasix drip.  The  patient's blood pressure dropped to 80 systolic and Lasix drip was  stopped on Jun 14, 2007.  He was continued on the p.o. Lopressor as  stated above.  The patient's volume overload remained stable following  discontinuation of the Lasix drip.   Postoperatively, the patient did  have some postoperative confusion.  There was a question whether the  patient had coughing with taking n.p.o.  A feed  study was ordered.  This was done on Jun 14, 2007.  This showed no aspiration or penetration  observed.  There was delayed initiation of swallow, which was felt  secondary to decreased sensation.  They recommended regular diet with  thin liquids, small sips of straws were okay.  The patient was started  on this diet.  During this time, his confusion did improve.  He  tolerated diet well.  No nausea or vomiting noted.  Postoperatively, the  patient did have some mild acute blood loss anemia and he did receive  transfusions immediately postoperatively.  Following these transfusions,  the patient was stabilized.  His hemoglobin and hematocrit remained  stable.  He remained asymptomatic.  Last H&H was 12 and 35.2 on postop  day #4.  Postoperatively, the patient was out of bed and ambulating well  without difficulty.  He was tolerating a regular diet well.   On postop day #4, the patient was afebrile.  Vital Signs:  Heart rate,  normal sinus rhythm in the 80s with blood pressure stable.  Sats were  greater than 90% on room air.  Labs showed a white count of 11.4,  hemoglobin of 12.0, hematocrit 35.2, and platelet count 195.  Sodium of  148, potassium of 3.5, chloride of 99, bicarbonate of 29, BUN of 19, and  creatinine of 1.17.  The patient was in normal sinus rhythm.  Pulmonary  status stable.  Incisions were all clean, dry, intact, and healing well.  The patient was felt to be ready for discharge home in the next 1-2 days  pending he remained stable.   FOLLOWUP APPOINTMENT:  Followup appointment has been arranged with Dr.  Cornelius Moras for June , 2009, at 1:30 p.m..  The patient will need to obtain PA  and lateral chest x-ray 30 minutes prior to this appointment.   ACTIVITIES:  The patient is instructed no driving until he is instructed  to do  so.  No lifting over 10 pounds.  He is told to ambulate 3-4 times  per day, progress as tolerated and continue his breathing exercises.   INCISIONAL CARE:  The patient is told to shower, washing his incisions  using soap and water.  He is to contact the office if he develops any  drainage or opening from any of his incision sites.   DIET:  The patient is educated on diet to be low fat and low salt.  DISCHARGE MEDICATIONS:  1. Aspirin 325 mg daily.  2. Lopressor 25 mg b.i.d.  3. Ultram 50 mg 1-2 tabs q.4-6 h. p.r.n.  4. Vicodin 5/500 mg 1-2 tablets q.4-6 h. p.r.n.     Theda Belfast, PA      [name], M.D.  Electronically Signed   KMD/MEDQ  D:  06/15/2007  T:  06/16/2007  Job:  [id number]

## 2010-06-15 NOTE — Assessment & Plan Note (Signed)
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  He was last seen here in the office on February 04, 2008.  Since then, he has continued to do well.  He has now had both of his  shoulder surgeries completed and has done well with these procedures.  He otherwise is doing fine.  He still has chronic back and hip pain, and  apparently is planning to have surgical correction of the lumbar sacral  degenerative disk disease at some point in the future.  He reports that  his blood pressure has remained under good control.  He has no other  significant complaints.  The remainder of his review of systems is  unremarkable.   PHYSICAL EXAMINATION:  GENERAL:  A well-appearing male.  VITAL SIGNS:  Blood pressure 140/88, pulse 74, and oxygen saturation  96%.  CHEST:  A median sternotomy scar has completely healed and the sternum  is stable.  The small scar in the right deltopectoral groove has also  healed completely.  LUNGS:  Auscultation of the chest reveals clear breath sounds which are  symmetrical.  CARDIOVASCULAR:  Regular rate and rhythm.  No murmurs, rubs, or gallops  are appreciated.  ABDOMEN:  Soft and nontender.  Femoral pulses are palpable.  EXTREMITIES:  Warm and well perfused.  There is no lower extremity  edema.   DIAGNOSTIC TESTS:  CT angiogram of the aorta performed earlier today at  the [facility] is reviewed.  This demonstrates stable  radiographic appearance of the thoracic and abdominal aorta.  There has  been no significant change whatsoever since last exam.  There is no  residual aortic dissection flap at all.  The aorta has not  changed in  size or caliber.   IMPRESSION:  Stable progress, now more than 1 year status post emergency  repair of acute type A aortic dissection.   PLAN:  We will plan a followup CT angiogram for surveillance in 6  months.   [name], M.D.  Electronically Signed   CHO/MEDQ  D:  08/11/2008  T:  08/12/2008  Job:  [id number]   cc:   Gypsy Balsam

## 2010-06-15 NOTE — Assessment & Plan Note (Signed)
OFFICE VISIT   [name], [name]  DOB:  02-28-1945                                        August 10, 2009  CHART #:  [id number]   HISTORY OF PRESENT ILLNESS:  The patient returns for routine followup of  aortic dissection.  He was last seen here in the office on May 04, 2009.  Since then, he has remained clinically stable.  He continues to  abstain from any tobacco use.  He lives a very sedentary lifestyle now  and is not getting out and doing much at all.  He has gained a little  weight.  He has had some increasing lower extremity edema.  He has  chronic pain in his back and shoulders that has not changed.  He has not  had any pain anteriorly in his chest.  The remainder of his review of  systems is entirely stable and unchanged from previously.  The remainder  of his past medical history is unchanged.   CURRENT MEDICATIONS:  1. Norvasc 10 mg every morning, 5 mg every evening.  2. Labetalol 800 mg twice daily.  3. Oxycodone as necessary for pain.  4. Prevacid 15 mg daily.  5. A stool softener.   PHYSICAL EXAMINATION:  Notable for a moderately obese Hispanic male who  appears his stated age in no acute distress.  Blood pressure 127/55,  pulse 60 and regular, oxygen saturation 95% on room air.  Auscultation  of the chest reveals clear breath sounds that are symmetrical  bilaterally.  No wheezes, rales, or rhonchi are noted.  Cardiovascular  exam is notable for regular rate and rhythm.  There is a grade 2/5  systolic murmur heard best along the sternal border.  No diastolic  murmurs are noted.  The abdomen is soft, nontender.  The extremities are  warm and well perfused.  There is mild bilateral lower extremity edema.   DIAGNOSTIC TESTS:  CT angiogram of the chest is performed earlier today  at [facility] and is reviewed and compared with previous  CT angiogram from March 25, 2009.  Today's exam remains unchanged  over the last several  months.  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  These have not gotten any larger in size since February.  The  remainder of the chronic aortic dissection remains unchanged with no  significant alterations and specifically no aneurysmal dilatation of any  segment of the aorta.  No other abnormalities are noted.   IMPRESSION:  The patient remains stable at this time.   PLAN:  We will have the patient return in 3 months with another followup  CT angiogram.  His blood pressure seems to be well controlled at this  time.  All of his questions have been addressed.   [name] [name], M.D.  Electronically Signed   CHO/MEDQ  D:  08/10/2009  T:  08/11/2009  Job:  [id number]   cc:   [name] [name], M.D.

## 2010-06-15 NOTE — Discharge Summary (Signed)
NAMEAARONJAMES, [name]                  ACCOUNT NO.:  [id number]   MEDICAL RECORD NO.:  [id number]          PATIENT TYPE:  INP   LOCATION:  2003                         FACILITY:  [facility]   PHYSICIAN:  [name], M.D. DATE OF BIRTH:  1945-05-06   DATE OF ADMISSION:  06/21/2007  DATE OF DISCHARGE:                               DISCHARGE SUMMARY   PRIMARY ADMITTING DIAGNOSIS:  Dizziness.   ADDITIONAL/DISCHARGE DIAGNOSES:  1. Pericardial effusion, status post repair of acute aortic      dissection.  2. Postoperative hypotension.  3. History of hypertension.  4. History of degenerative cervical and lumbar disk disease.  5. Chronic pain.   PROCEDURE PERFORMED:  Subxiphoid pericardial window.   HISTORY:  The patient is a 65 year old male who recently underwent  emergent repair of an acute type A aortic dissection.  He was discharged  home on Jun 16, 2007, in good condition.  However, following his  discharge, he developed dizziness and confusion and presented to the  emergency department for further evaluation.  CT of the chest was  performed, which showed a normal arch aortogram of the thoracic aorta,  but also showed a moderate to large size pericardial effusion.  Because  of these findings, he was admitted for further workup.  A 2-D  echocardiogram was performed, which showed a large free flowing  pericardial effusion with no overt tamponade, but it was felt that the  effusion was most likely causing his symptoms.  Because of this, Dr.  Cornelius Moras felt he would benefit from a subxiphoid pericardial window.  He  explained the risks, benefits, and alternatives of the surgery to the  patient, and he agreed to proceed.  He was taken to the operating room  on Jun 22, 2007, and underwent pericardial window by Dr. Cornelius Moras.  Approximately, 500 mL of bloody fluid was evacuated.  The patient  tolerated the procedure well and was transferred to the SICU in stable  condition.  Postoperatively, he has  done well.  His chest tube was  removed on postop day #2 after all drainage had ceased.  He was able to  be transferred later that day to the floor.  He has had some hypotension  postoperatively, and his beta-blocker was discontinued, and he was  treated with volume resuscitation.  He continues to run systolics in the  90s-100 range, but appears to be tolerating this well.  He is currently  doing well on the floor.  He is afebrile and all vital signs have been  stable.  He states he is feeling much better and his breathing is  stable.  He is maintaining O2 sats of greater than 90% on room air.  He  is ambulating without problem.  His incisions are all healing well.   His labs have remained stable with hemoglobin 9.7, hematocrit 28.4,  platelets 360, and white count 11.5.  Sodium 139, potassium 4.0, BUN 9,  and creatinine 0.9.  It is felt that since he has remained stable and is  progressing well that he will hopefully be ready for discharge home  within the next 24 hours.   DISCHARGE MEDICATIONS:  As follows.  1. Hydrocodone 5/500 one q.4-6 h. p.r.n. for pain.  2. Ultram 50 mg one q.6 h. p.r.n.  3. Carisoprodol 350 mg one half to one tablet nightly p.r.n.   DISCHARGE INSTRUCTIONS:  He is asked to refrain from driving, heavy  lifting, or strenuous activity.  He may continue ambulating daily using  his incentive spirometer.  He may shower daily and clean his incisions  with soap and water.  He will continue a low-fat, low-sodium diet.   DISCHARGE FOLLOWUP:  He will keep his previously scheduled follow up  appointment with Dr. Cornelius Moras, and have a chest x-ray prior to this  appointment at [facility].  In the interim, if he experiences  problems or has questions, he is asked to contact our office  immediately.       Coral Ceo, P.A.      [name], M.D.  Electronically Signed    GC/MEDQ  D:  06/25/2007  T:  06/25/2007  Job:  [id number]

## 2010-06-15 NOTE — Op Note (Signed)
[name] [name], [name] [name]                  ACCOUNT NO.:  [id number]   MEDICAL RECORD NO.:  [id number]           PATIENT TYPE:   LOCATION:                                 FACILITY:   PHYSICIAN:  [name], M.D. DATE OF BIRTH:  May 03, 1945   DATE OF PROCEDURE:  DATE OF DISCHARGE:                               OPERATIVE REPORT   PREOPERATIVE DIAGNOSIS:  Acute type A aortic dissection with pericardial  tamponade.   POSTOPERATIVE DIAGNOSIS:  Acute type A aortic dissection with  pericardial tamponade.   PROCEDURE:  Emergency median sternotomy for hemi-arch repair of acute  type A aortic dissection with resuspension of aortic valve using right  axillary artery cannulation with total circulatory arrest and profound  hypothermia.   SURGEON:  [name], MD.   ASSISTANTDoree Fudge, PA.   ANESTHESIA:  General.   BRIEF CLINICAL NOTE:  The patient is a 65 year old Hispanic male with  history of hypertension who presents with a 2-day history of chest pain  that progressed to sudden onset of severe substernal chest pain on the  evening of Jun 10, 2007.  The patient presented to the emergency  department where he also suffered a syncopal episode.  Chest CT scan  demonstrates acute type A aortic dissection with hemopericardium.  At  the time of presentation, the patient remained awake and alert although  severely anxious and diaphoretic.  The patient, his wife, and his son  have been counseled regarding the potentially, imminently, life-  threatening nature of his problem and the need to proceed emergently for  surgical repair.  They have been counseled regarding potential for a  variety of complications as well as the potential need for transfusion  of blood products.  All their questions have been addressed, and the  patient was brought promptly directly from the emergency department to  the operating room on the early morning hours of Jun 11, 2007.   OPERATIVE NOTE  DETAIL:  The patient was brought directly from the  emergency room to the operating room on the morning of Jun 11, 2007, and  placed in the supine position on the operating table.  General  endotracheal anesthesia was induced uneventfully under the care and  direction of Dr. Laverle Hobby.  Intravenous antibiotics were  administered.  Central monitoring was established including right  internal jugular catheter with a Swan-Ganz catheter placement.  Bilateral radial arterial lines were placed.  A Foley catheter was  placed.  The patient's neck, chest, abdomen, both groins, and both lower  extremities were prepared and draped in sterile manner.   Baseline transesophageal echocardiogram was performed by Dr. Katrinka Blazing.  This confirms the presence of acute type A aortic dissection.  There was  moderate aortic insufficiency.  There was evidence for pericardial  tamponade, although the patient's blood pressure remained stable  averaging between 100-110 mmHg systolic.   A median sternotomy incision was performed.  Pericardium was opened, and  the patient's blood pressure immediately raised and subsequently  stabilized.  A surgical incision was made in the right delta pectoral  groove approximately 1 fingerbreadth below the right clavicle.  The  right axillary artery was exposed to this incision after retracting the  body of the pectoralis major muscle inferiorly.  Care was taken to  dissect only anterior to the vessels to avoid injury to the subjacent  brachial plexus.  The axillary artery was identified and encircled with  Vesseloop.  The patient was heparinized systemically.  An 8-mm acron  tubular graft was sewn in an end-to-side fashion to the axillary artery  to be utilized for arterial inflow.  This acron graft was then directly  hooked up to the arterial cannula for cardiopulmonary bypass.   The venous cannula was placed through the tip of the right atrium.  A  retrograde cardioplegic  catheter was placed through the right atrium  into the coronary sinus.  Cardiopulmonary bypass was begun and a left  ventricular vent was placed through the right superior pulmonary vein.  Systemic cooling was begun.  Dissection was continued anteriorly and one  can appreciate obvious changes of acute aortic dissection involving the  entire ascending thoracic aorta.  There was clotted blood in the  pericardial sac, but no significant ongoing bleeding.  A temperature  probe was placed in the left ventricular septum.  The innominate vein  was dissected away from associated structures and retracted superiorly  to facilitate exposure of aortic arch.   An aortic cross-clamp was subsequently placed directly across the mid  portion of the ascending thoracic aorta.  Cold blood cardioplegia was  administered retrograde through the coronary sinus catheter.  Iced  saline flush was applied for topical hypothermia.  The initial  cardioplegic arrest and myocardial cooling was notably excellent.  Repeat doses of cardioplegia were administered intermittently throughout  the cross-clamp portion of the operation retrograde through the coronary  sinus catheter to maintain left ventricular septal temperature below 15  degree centigrade.   Ascending thoracic aorta was transected just below the cross-clamp.  There was an obvious acute aortic dissection.  The aorta was dissected  retrograde towards the aortic root.  The dissection continued down into  the aortic root, particularly in the noncoronary sinus of Valsalva.  The  dissection also continued down around portion of the left main coronary  artery.  The aorta was trimmed back to a level of the sinotubular  junction.  The aortic valve was tricuspid and otherwise normal in  appearance with insufficiency caused secondary to the dissection itself.  There were no fenestration the valve and no other abnormalities  appreciated.  Clotted blood was evacuated from  the dissected portion of  the aortic root.  The aortic valve was resuspended using 4-0 Prolene  pledgeted sutures placed at the apex of each of the 3 commissures of the  aortic valve.  The dissected wall of the aortic root is now  reapproximated using BioGlue.  Sinotubular junction measures 28 mm in  diameter.  A 28-mm Hemashield straight aortic graft was sewn in an end-  to-end fashion to the sinotubular junction of the aortic root.  This was  performed using interrupted 2-0 Ethibond horizontal mattress pledgeted  sutures.  The proximal anastomosis was reinforced with BioGlue.   The patient was administered 1 g Solu-Medrol intravenously and high-dose  sodium Pentothal.  Core body temperature was maintained 18 degrees  centigrade for approximately 30 minutes prior to commencing total  circulatory arrest.  At this point, the patient was placed in steep  Trendelenburg position and the head was packed in ice.  Cardiopulmonary  bypass was discontinued, and the patient's blood volume was  exsanguinated into the cardiotomy reservoir.  The aortic cross-clamp was  removed.  The distal extent of the aortic dissection and the aortic arch  were inspected and subsequently dissected away from associated  structures.  This dissection extends along the underside of the aortic  arch to a level just beyond the ligamentum arteriosum.  The arch vessels  were spared involvement of the dissection, and as such hemiarch  replacement was felt to be technically feasible.  A cross-clamp is now  replaced across the innominate artery and the origin of the left common  carotid artery.  Partial antegrade perfusion was reinstituted at this  point after total circulatory arrest time of 7 minutes.   Portion of the 28-mm aortic graft was transected in its mid portion and  the distal end was beveled and subsequently sewn in an end-to-end  fashion to the underside of the aortic arch.  Interrupted pledgeted  sutures were  used for the most caudad extent of this anastomosis on the  underside of the aortic arch, and the remainder of the anastomosis was  completed with running 4-0 Prolene suture.  The anastomosis was  reinforced with BioGlue.  A cross-clamp was now placed across this  distal aortic graft after evacuation of all air through the aortic root.  The cross-clamp across the innominate artery and carotid artery is now  removed, and full antegrade perfusion was reinstituted.  The distal  anastomosis was inspected for hemostasis.  Total elapsed time with  partial antegrade perfusion was 50 minutes.   The 2 Hemashield grafts are now beveled and trimmed to appropriate  length and sewn together in an end-to-end fashion in the mid portion of  the ascending thoracic aorta.  During this time,. rewarming was begun.  One final dose of warm retrograde hot shot cardioplegia was  administered.  Thermal cautery was utilized to create a small hole in  the ascending thoracic aortic graft to evacuate any residual air.  The  aortic cross-clamp was now removed after total cross-clamp time of 133  minutes, including 50 minutes of partial antegrade perfusion and 7  minutes total circulatory arrest.   The entire graft was carefully inspected for meticulous hemostasis.  An  epicardial pacing wire was fixed to the right ventricular free wall into  the right atrial appendage.  The lungs were ventilated and rewarming was  continued for an extended period of time.  Ultimately, normal sinus  rhythm resumed spontaneously without need for cardioversion.  The  patient was rewarmed to 37 degrees centigrade temperature.  At this  juncture, the patient was weaned from cardiopulmonary bypass without  difficulty.  The patient's rhythm at separation from bypass was normal  sinus rhythm.  No inotropic support was required.  Total cardiopulmonary  bypass time at the time of the operation was 222 minutes.   Follow up transesophageal  echocardiogram after separation from bypass  demonstrates normal left ventricular function.  The aortic valve appears  to be functioning normally with no aortic insufficiency.  There was no  sign of residual air and no other abnormalities were noted.   The venous cannula was removed uneventfully.  Protamine was administered  to reverse the anticoagulation.  The right axillary artery graft was  transected within 6 mm of the anastomosis on the axillary artery itself  and this small stump of graft was oversewn with Prolene suture.   The mediastinum was irrigated with saline solution  containing  vancomycin.  Meticulous surgical hemostasis was ascertained.  There was  moderate coagulopathy.  The patient was transfused 2 units fresh frozen  plasma and 2 packs of platelets for thrombocytopenia and coagulopathy.  No packed red blood cells were required.   The right subclavian incision was closed in multiple layers and the skin  was closed with subcuticular skin closure.  The mediastinum was drained  using 2 chest tubes exited through separate stab incisions inferiorly.  The pericardium and soft tissues anterior to the aortic graft were  reapproximated loosely and the sternum was closed with double-strength  sternal wire.  The soft tissues anterior to the sternum were closed in  multiple layers, and the skin was closed with a running subcuticular  skin closure.   The patient tolerated the procedure well and was transported to the  Surgical Intensive Care Unit in stable condition.  There were no  intraoperative complications.  All sponge, instrument, and needle counts  were verified correct at completion of the operation.      [name], M.D.  Electronically Signed     CHO/MEDQ  D:  06/11/2007  T:  06/11/2007  Job:  [id number]

## 2010-06-15 NOTE — Assessment & Plan Note (Signed)
OFFICE VISIT   [name] [name], [name] [name]  DOB:  07-Jul-1945                                        November 09, 2009  CHART #:  [id number]   ADDENDUM.   Followup CT angiogram of the chest and abdomen for the patient is  reviewed.  This reveals essentially stable radiographic appearance of  his entire ascending thoracic, descending thoracic, and abdominal aorta.  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  There does not  appear to be any significant change in the radiographic appearance of  this over the past 3 months.  The aortic dissection involving the  descending thoracic and abdominal aorta also remains stable with no  significant aneurysmal enlargement of the remaining aorta.  No new  abnormalities are noted.   [name], M.D.  Electronically Signed   CHO/MEDQ  D:  11/09/2009  T:  11/10/2009  Job:  [id number]   cc:   Duanne Moron, M.D.  Erick Colace, M.D.

## 2010-06-15 NOTE — Assessment & Plan Note (Signed)
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  He was last seen here in  the office November 09, 2009.  Since then, it sounds as though he has  remained clinically stable.  The patient has a long history of chronic  physical complaints that are difficult to sort out.  He has chronic  chest pain, back pain, shortness of breath and anxiety.  I spent in  excess of 20 minutes with the patient and his family again in the office  today.  I cannot appreciate any difference in the complaints in  comparison with previous exams.  The chest pain he describes seems to be  positional.  The back pain seems to come and go sporadically.  It  certainly could be related to his aortic dissection, although could also  be related to degenerative disease of his spine as well as his history  of trauma in the past.  It sounds as though he is doing better job with  maintaining his blood pressure under control.  The remainder of his  review of systems are unchanged from previously.  The remainder of his  past medical history is unchanged.   Current medications are not available for review.   PHYSICAL EXAMINATION:  General:  Notable for well-appearing, moderately  obese, [place] male with blood pressure 130/66, pulse 61, oxygen  saturation 96% on room air.  Chest:  Auscultation of the chest reveals  clear breath sounds.  Breath sounds are symmetrical.  No wheezes, rales  or rhonchi are noted.  Cardiovascular:  Notable for regular rate and  rhythm.  No murmurs, rubs or gallops are noted.  Abdomen:  Soft,  nontender.  Extremities:  Warm and well-perfused.   DIAGNOSTIC TEST:  CT angiogram of the chest, abdomen and pelvis  performed earlier today is reviewed.  This demonstrates no  significant  change in comparison with the CT angiogram performed on October 2011.  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   IMPRESSION:  The patient's chronic aortic dissection involving the  descending thoracic and abdominal aorta remains radiographically stable.  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  I do not appreciate any  significant change in his long history of multiple physical complaints.  His blood pressure seems to be under reasonably good control.   PLAN:  We will plan to see the patient back for further followup and  repeat CT angiogram in 6 months.   [name], M.D.  Electronically Signed   CHO/MEDQ  D:  02/15/2010  T:  02/16/2010  Job:  [id number]   cc:   Durenda Hurt, M.D.  Baltazar Apo, M.D.

## 2010-06-15 NOTE — Assessment & Plan Note (Signed)
OFFICE VISIT   [name] [name], [name] [name]  DOB:  [birth year]                                        November 09, 2009  CHART #:  [id number]   HISTORY OF PRESENT ILLNESS:  The patient returns for followup of his  aortic dissection.  He originally presented with an acute type A aortic  dissection complicated by pericardial tamponade in May 2009.  He  underwent emergency hemiarch repair of his acute aortic dissection with  resuspension of the native aortic valve.  He initially did well.  He had  some problems with compliance with medical therapy as well as difficulty  with long-term attention to management of severe hypertension.  He later  developed type B aortic dissection involving the remaining descending  thoracic and abdominal aorta, which was not originally involved at the  time of his original presentation in 2009.  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  He has chronic pain that presumably is at least partially  related to his chronic dissection involving the descending aorta.  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  He returns to the office today for routine followup.  Overall, the  patient remains essentially stable.  He still describes a constant like  pain involving his upper and mid back.  In addition, he has occasional  paroxysms of severe pinching like chest pain located in the mid  anterior chest.  This pain is not related to physical activity and seems  to come and go sporadically.  According to the patient and his wife,  these episodes of pain are usually brief, lasting only 2 or 3 minutes at  most.  They are not associated with shortness of  breath.  He has never  had any tachy palpitations or dizzy spells.  He has not had any problems  with resting shortness of breath, PND, orthopnea, or lower extremity  edema.  Overall, his symptoms remained completely stable.  The remainder  of his past medical history is unchanged.   CURRENT MEDICATIONS:  1. Prevacid.  2. Norvasc 10 mg twice daily.  3. Colace.  4. Labetalol 800 mg twice daily.  5. Metaxalone 800 mg 3 times daily.   PHYSICAL EXAMINATION:  Notable for well-appearing male with blood  pressure 113/60, pulse 59 and regular, and oxygen saturation 94% on room  air.  Examination of the chest reveals clear breath sounds that are  symmetrical bilaterally.  No wheezes, rales, or rhonchi are noted.  Cardiovascular exam is notable for regular rate and rhythm.  No murmurs,  rubs, or gallops are appreciated.  The abdomen is soft and nontender.  Bowel sounds are present.  The extremities are warm and well perfused.  There is no lower extremity edema.   IMPRESSION:  The patient remains stable from a clinical standpoint.  He  suffers from chronic pain that presumably is at least partially related  to his chronic aortic dissection involving the descending thoracic and  abdominal aorta.  He also has intermittent paroxysms of epigastric  chest  pain of unclear etiology.  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  Followup CTA of the  thoracic and abdominal aorta is performed today and remains pending at  this time.  The patient's blood pressure appears to be under excellent  control at this time.  He has recently [facility] help with long-term attention to pain  relief.   PLAN:  We will follow up on CT angiogram performed today at the  [facility].  Presuming that there are not any substantial  changes at this time, we will continue to plan to follow up with  further  surveillance every 3 months for the time being.  Next time, we will  obtain a 2-D echo to evaluate left ventricular function in the presence  or absence of any significant aortic insufficiency.  [name] [name] and his  wife understand that there remains a risk of sudden death.  They also  understand that repeat surgical intervention to treat either the false  aneurysm or the descending thoracic aortic dissection would come with  high risk.  They have been reminded that he should refrain from any sort  of strenuous physical activity.   [name] Decent. Cornelius [name], M.D.  Electronically Signed   CHO/MEDQ  D:  11/09/2009  T:  11/10/2009  Job:  [id number]   cc:   Durenda Hurt, M.D.  Baltazar Apo, M.D.

## 2010-07-21 ENCOUNTER — Other Ambulatory Visit: Payer: Self-pay | Admitting: Thoracic Surgery (Cardiothoracic Vascular Surgery)

## 2010-07-21 DIAGNOSIS — I7100 Dissection of unspecified site of aorta: Secondary | ICD-10-CM

## 2010-07-21 DIAGNOSIS — I729 Aneurysm of unspecified site: Secondary | ICD-10-CM

## 2010-08-16 ENCOUNTER — Ambulatory Visit: Payer: Self-pay | Admitting: Thoracic Surgery (Cardiothoracic Vascular Surgery)

## 2010-08-16 ENCOUNTER — Inpatient Hospital Stay: Admission: RE | Admit: 2010-08-16 | Payer: Self-pay | Source: Ambulatory Visit

## 2010-10-27 LAB — BLOOD GAS, ARTERIAL
Bicarbonate: 20.8
O2 Content: 4
Patient temperature: 100.1
Patient temperature: 98.6
TCO2: 22
pH, Arterial: 7.347 — ABNORMAL LOW
pH, Arterial: 7.445

## 2010-10-27 LAB — CBC
HCT: 26.8 — ABNORMAL LOW
HCT: 28.3 — ABNORMAL LOW
HCT: 28.4 — ABNORMAL LOW
HCT: 35.2 — ABNORMAL LOW
Hemoglobin: 12 — ABNORMAL LOW
Hemoglobin: 9.2 — ABNORMAL LOW
Hemoglobin: 9.4 — ABNORMAL LOW
Hemoglobin: 9.7 — ABNORMAL LOW
MCHC: 34
MCHC: 34
MCHC: 34.1
MCV: 101.9 — ABNORMAL HIGH
MCV: 102.6 — ABNORMAL HIGH
Platelets: 333
Platelets: 360
RBC: 2.57 — ABNORMAL LOW
RBC: 2.63 — ABNORMAL LOW
RBC: 2.77 — ABNORMAL LOW
RBC: 2.77 — ABNORMAL LOW
RBC: 3.46 — ABNORMAL LOW
RDW: 16.8 — ABNORMAL HIGH
WBC: 11.5 — ABNORMAL HIGH
WBC: 6.5
WBC: 8.1

## 2010-10-27 LAB — COMPREHENSIVE METABOLIC PANEL
ALT: 60 — ABNORMAL HIGH
AST: 27
Albumin: 2.7 — ABNORMAL LOW
Alkaline Phosphatase: 68
BUN: 9
CO2: 30
Calcium: 8.5
Chloride: 98
Creatinine, Ser: 1.09
GFR calc Af Amer: >60
GFR calc non Af Amer: >60
Glucose, Bld: 129 — ABNORMAL HIGH
Potassium: 4
Sodium: 139
Total Bilirubin: 1.3 — ABNORMAL HIGH
Total Protein: 5.6 — ABNORMAL LOW

## 2010-10-27 LAB — BASIC METABOLIC PANEL
CO2: 29
Calcium: 8.5
Calcium: 8.6
Calcium: 8.6
Chloride: 105
Chloride: 99
Creatinine, Ser: 0.96
Creatinine, Ser: 0.99
Creatinine, Ser: 1.17
GFR calc Af Amer: >60
GFR calc Af Amer: >60
GFR calc Af Amer: >60
GFR calc Af Amer: >60
GFR calc non Af Amer: >60
Potassium: 3.5
Potassium: 4.7
Sodium: 135
Sodium: 139

## 2010-10-27 LAB — TYPE AND SCREEN

## 2010-10-27 LAB — BODY FLUID CULTURE: Culture: NO GROWTH

## 2010-10-27 LAB — DIFFERENTIAL
Lymphocytes Relative: 19
Lymphs Abs: 1.6
Monocytes Absolute: 1.2 — ABNORMAL HIGH
Monocytes Relative: 14 — ABNORMAL HIGH
Neutro Abs: 5.6

## 2010-10-27 LAB — APTT: aPTT: 34

## 2010-11-09 ENCOUNTER — Encounter: Payer: Self-pay | Admitting: *Deleted

## 2010-11-10 ENCOUNTER — Encounter: Payer: Self-pay | Admitting: Thoracic Surgery (Cardiothoracic Vascular Surgery)

## 2010-11-10 ENCOUNTER — Ambulatory Visit (INDEPENDENT_AMBULATORY_CARE_PROVIDER_SITE_OTHER): Payer: Medicare Other | Admitting: Thoracic Surgery (Cardiothoracic Vascular Surgery)

## 2010-11-10 VITALS — BP 108/57 | HR 64 | Resp 18 | Ht 66.0 in | Wt 230.0 lb

## 2010-11-10 DIAGNOSIS — I7101 Dissection of thoracic aorta: Secondary | ICD-10-CM | POA: Insufficient documentation

## 2010-11-10 DIAGNOSIS — I729 Aneurysm of unspecified site: Secondary | ICD-10-CM | POA: Insufficient documentation

## 2010-11-10 NOTE — Patient Instructions (Signed)
The patient requests to be sent to [facility] to see Dr. Kizzie Bane for consultation. We have given him copies of his previous operative notes, office notes, and hospital discharge summaries to take with him. He has been instructed to stop at [facility] imaging today to pick up a CD with copies of all of his old previous CT angiograms for review. He will also need to go to [facility] to get a copy of the CT angiogram performed yesterday placed on a CD to take with him for this consultation at [facility].

## 2010-11-10 NOTE — Progress Notes (Signed)
PCP is REDDING Valrie Hart., MD Referring Provider is No ref. provider found  Chief Complaint  Patient presents with  . Follow-up    repair of type A aortic dissection with ct chest/abd/pelvis    HPI:  Patient is a 65 year old [place] male who underwent emergency resection and grafting of the ascending thoracic aorta for acute type A aortic dissection on 06/10/2007. At that time he presented in pericardial tamponade with a DeBakey Type 2 aortic dissection that stopped at the level of the ligamentum arteriosum. He underwent hemi-arch resection and grafting of the ascending thoracic aorta with resuspension of the native aortic valve. He initially did well although he did require subxiphoid pericardial window 2 weeks later for pericardial effusion. Initial followup CT angiogram is looked good. However, the patient was lost to medical followup for some period of time. He went back to smoking and stopped seeing his cardiologist for blood pressure management. He started lifting weights despite consultation to avoid such activity.  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 These were first seen on CT angiogram performed 02/18/2009. On 03/24/2009 the patient presented with an acute type B aortic dissection. At that time the small false aneurysms in the aortic root remained stable radiographically. Since then the patient quit smoking and has done much better job with respect to blood pressure control and care with regards to his physical activity. We have been following him routinely for repeat CT angiogram and he was seen most recently on 02/15/2010. CT angiogram performed at that time remained essentially stable. The patient was scheduled to come back for followup several months ago, but he had problems with his insurance company denying coverage for his followup CT angiograms. Ultimately a followup CT  angiogram was performed yesterday at [facility] in [place]. This revealed interval increase in size of the false aneurysms in the aortic root. The patient has been referred back to our office for followup.   Past Medical History  Diagnosis Date  . Hypertension   . Ascending aortic dissection 06/10/2007    acute type A dissection with pericardial tamponade  . Descending thoracic aortic dissection 03/24/2009    acute type B aortic dissecton  . DJD (degenerative joint disease), lumbosacral   . Chronic back pain   . Chronic chest pain   . SOB (shortness of breath) on exertion   . 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 Repair aortic dissection 06/10/2007    hemiarch replacement of ascending aorta with resuspension of aortic valve  . Subxyphoid pericardial window 06/22/2007    History reviewed. No pertinent family history.  Social History History  Substance Use Topics  . Smoking status: Former Smoker    Quit date: 03/24/2009  . Smokeless tobacco: Never Used  . Alcohol Use: No    Current Outpatient Prescriptions  Medication Sig Dispense Refill  . amLODipine (NORVASC) 10 MG tablet Take 10 mg by mouth daily. In the am       . amLODipine (NORVASC) 5 MG tablet Take 5 mg by mouth daily. At bedtime        . docusate sodium (COLACE) 100 MG capsule Take 200 mg by mouth 1 day or 1 dose.        . labetalol (NORMODYNE) 200 MG tablet Take 200 mg by mouth  2 (two) times daily. Takes 4 tabs bid       . oxycodone (OXY-IR) 5 MG capsule Take 5 mg by mouth every 4 (four) hours as needed. 1 or 2 tabs q4-6hrs prn       . pantoprazole (PROTONIX) 40 MG tablet Take 40 mg by mouth 2 (two) times daily.          No Known Allergies  Review of Systems  Constitutional: Positive for unexpected weight change.       He has gained 20 pounds  HENT: Negative.   Eyes: Negative.   Respiratory:  Positive for apnea and shortness of breath.        Difficulties sleeping has been improved with CPAP for obstructive sleep apnea. The patient has chronic exertional shortness of breath which is stable. He denies productive cough, hemoptysis, wheezing.  Cardiovascular: Positive for chest pain.       The patient has chronic exertional shortness of breath which is stable. He denies resting shortness of breath, PND, orthopnea, syncope. The patient has chronic chest pain which is atypical in nature and not necessarily related to physical exertion.  Gastrointestinal: Negative.   Genitourinary: Negative.   Musculoskeletal: Positive for myalgias, back pain and arthralgias.  Neurological: Negative.   Hematological: Negative.   Psychiatric/Behavioral: Negative.     BP 108/57  Pulse 64  Resp 18  Ht 5\' 6"  (1.676 m)  Wt 230 lb (104.327 kg)  BMI 37.12 kg/m2  SpO2 97% Physical Exam  Vitals reviewed. Constitutional: He is oriented to person, place, and time. He appears well-developed and well-nourished.  HENT:  Head: Normocephalic.  Eyes: Pupils are equal, round, and reactive to light.  Neck: No JVD present.  Cardiovascular: Normal rate, regular rhythm and normal heart sounds.   No murmur heard. Pulmonary/Chest: Effort normal and breath sounds normal. No respiratory distress. He has no wheezes. He has no rales. He exhibits no tenderness.  Abdominal: Soft. He exhibits no mass. There is no tenderness.  Musculoskeletal: He exhibits no edema.  Lymphadenopathy:    He has no cervical adenopathy.  Neurological: He is alert and oriented to person, place, and time.  Skin: Skin is warm and dry.  Psychiatric: He has a normal mood and affect. His behavior is normal. Thought content normal.     Diagnostic Tests:  CT angiogram of the chest abdomen and pelvis performed yesterday at [facility] has been reviewed. This demonstrates interval increase in size of the false aneurysm involving the aortic  root. This false aneurysm now measures between 3 and 4 cm in its greatest dimension, and this is a significant increase in comparison with January of this year. The false aneurysm does not appear to extend to or communicate with the aortic arch or distal anastomosis of the aortic graft. There is a chronic type B aortic dissection that appears to begin from the underside of the aortic arch just beyond the distal tip of the distal anastomosis. This appears essentially stable. The maximum transverse diameter of the dissected descending thoracic aorta is approximately 4 cm. This may have increased slightly in size over the past year. No other significant changes are noted.   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 The patient has chronic atypical chest pain which may or may not be related to these  false aneurysms and/or the presence of his known chronic type B aortic dissection. Options at this time include continued observation versus redo sternotomy for aortic root replacement with or without concomitant debranching of the aortic arch vessels and possible stent grafting of the descending thoracic aortic dissection. With his chronic atypical chest pain and increasing size of the false aneurysms involving the aortic root, I'm concerned that he remains a fairly high-risk for an acute event if something isn't done surgically. However, surgery will come with exceptionally high-risk. Emergent surgical intervention at the time of acute decompensation would likely be futile. As a result, I suspect that the patient should consider high-risk surgery in the near future if he is ever going to have anything done to treat his chronic aortic problem that will ultimately likely cause his demise. He does not have a  murmur on physical exam, but echocardiogram is certainly needed to evaluate the competency of his aortic valve. At surgery I suspect the aortic valve would need to be replaced. Finally, he has never had his coronary artery anatomy imaged and he probably needs either cardiac catheterization or cardiac gated CT scan to image the proximal coronary vessels.   Plan:  I've discussed matters at length with the patient and his wife and son here in the office today. The patient is a difficult historian and his family seems to have an inconsistent ability to understand his underlying medical conditions. Nevertheless, we spent in excess of an hour discussing these issues and options, and at this point in time I feel that they have at least a temporary understanding of the seriousness of this problem. I have offered to admit the patient directly to the hospital today for further diagnostic testing in anticipation of possible high-risk surgery. We also discussed the possibility of consultation with Dr. Kizzie Bane at [facility] because of his experience with complex aortic surgery and because of their technological capabilities to potentially perform stent grafting of the descending thoracic aorta it might be necessary. They understand that referral to [facility] might potentially delay surgical intervention.  They understand that although the patient's problems have been present for quite some time, the patient certainly could become unstable at any moment. All of their questions have been addressed.

## 2011-12-29 IMAGING — CT CT ANGIO CHEST
2 of 8 series · 7 of 30 positions shown · IV contrast ([ID] [id number])
Comparison: 08/11/2008

CTA CHEST

Addendum Begins

I personally called this report to Wartha, Dr. Lawyer Ahmad nurse, at
[DATE] p.m. on 02/18/2009.
Addendum Ends
CLINICAL DATA: Follow-up ascending thoracic aortic dissection.
CT ANGIOGRAPHY CHEST AND ABDOMEN
TECHNIQUE: Multidetector CT imaging of the chest and abdomen was
performed using the standard protocol during bolus administration
of intravenous contrast.  Multiplanar CT image reconstructions
including MIPs were obtained to evaluate the vascular anatomy.
Contrast: 100 ml [id number]

[Series 5: angio · axial · 0.78mm/px · z∈[-332,-54]mm · 4 of 185 slices shown]
[im 37/185  lung]
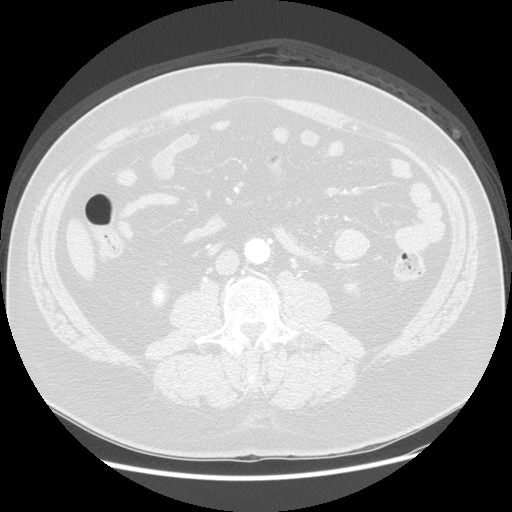
[im 74/185  mediastinal]
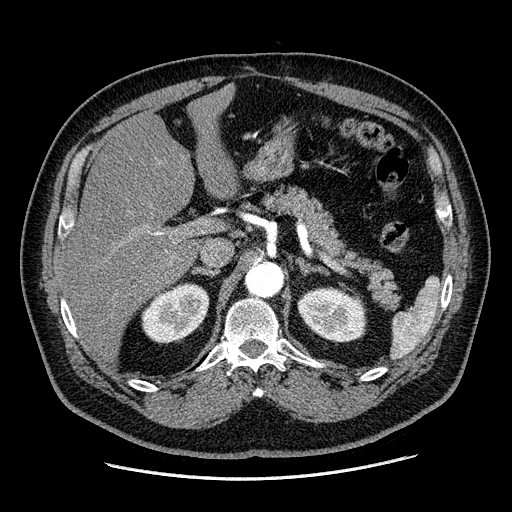
[im 111/185  lung]
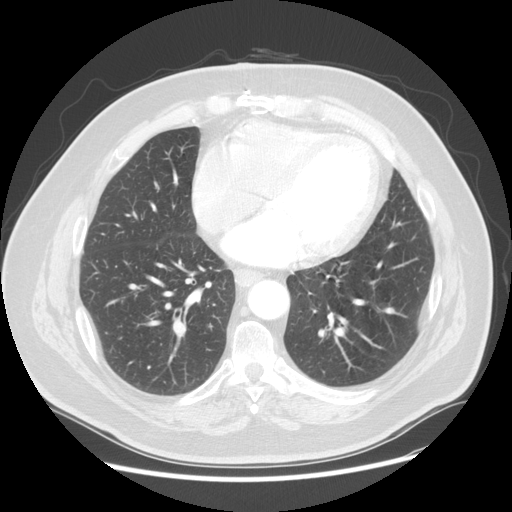
[im 148/185  mediastinal]
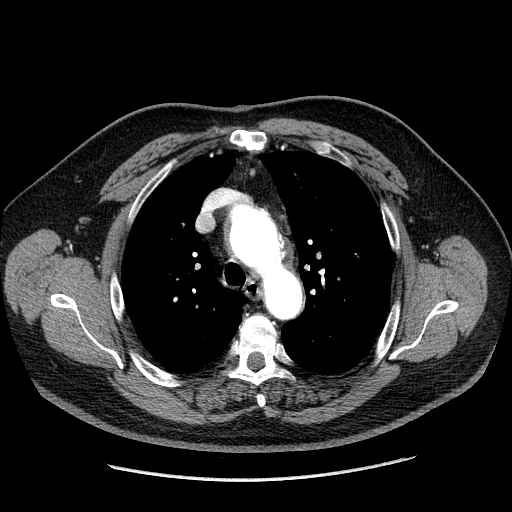

[Series 602: sagittal body · sagittal · 0.90mm/px · 3 of 161 slices shown]
[im 41/161  lung]
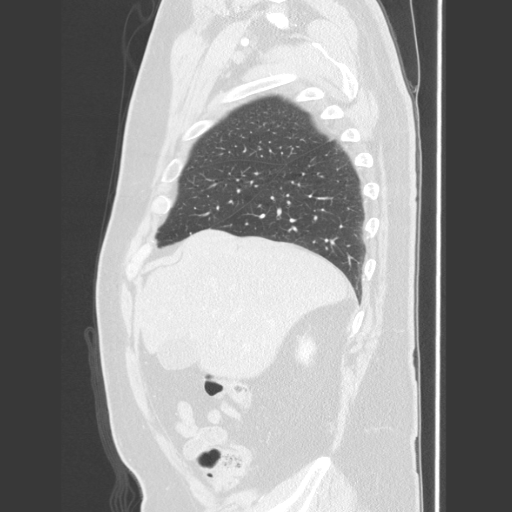
[im 81/161  lung]
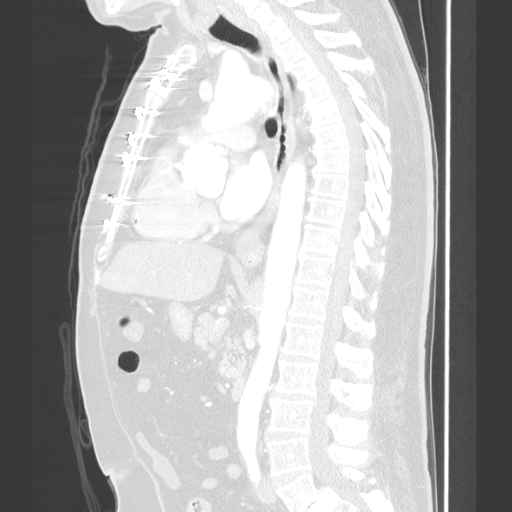
[im 121/161  lung]
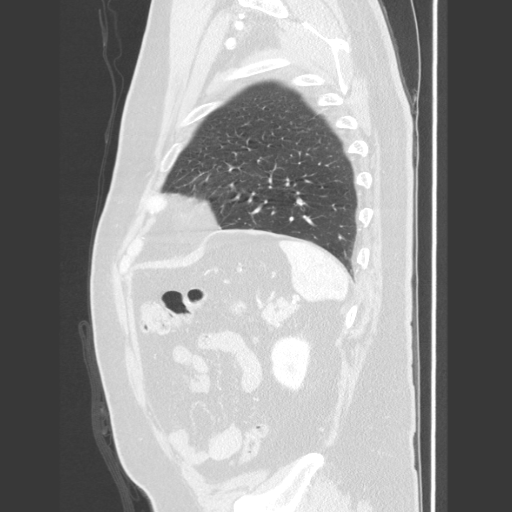

[7 of 30 positions shown; findings below may reference images not displayed]

FINDINGS: No pathologically enlarged mediastinal, hilar or
axillary lymph nodes.  The patient is status post repair of a type
A aortic dissection.  Along the proximal ascending aorta, there are
several outpouchings of contrast, which appear new from baseline
postoperative exam of 06/21/2007.  Maximum diameter of the
ascending aorta is approximate 3.8 cm.  Heart is at the upper
limits of normal in size.  Pulmonary arteries appear mildly
enlarged.  Small hiatal hernia.

Lungs are clear.  No pleural fluid.  Airway is unremarkable.

 Review of the MIP images confirms the above findings.
IMPRESSION: 1.  Postoperative changes of type A aortic dissection.
Outpouchings of contrast along the proximal ascending aorta appear
new from baseline postoperative exam of 06/21/2007, and are
suspicious for pseudoaneurysm formation.
2.  Suspect pulmonary arterial hypertension.

CTA ABDOMEN
FINDINGS: Liver appears decreased in attenuation diffusely.
Liver, gallbladder, and adrenal glands are unremarkable.  Low
attenuation lesions in the kidneys measure up to 2.5 cm on the
right, and are likely cysts.  Spleen, pancreas, stomach and
visualized bowel are otherwise unremarkable.  There are scattered
unenlarged lymph nodes in the abdomen.  No evidence of abdominal
aortic aneurysm.  No free fluid.

There are chronic bilateral L5 pars defects with near grade II
anterolisthesis of L5 on S1.  No worrisome lytic or sclerotic
lesions.

 Review of the MIP images confirms the above findings.
IMPRESSION: 1.  Fatty liver.
2.  Near grade II anterolisthesis of L5 on S1, secondary to chronic
bilateral pars defects.

## 2012-11-02 DIAGNOSIS — I7123 Aneurysm of the descending thoracic aorta, without rupture: Secondary | ICD-10-CM | POA: Insufficient documentation

## 2012-11-02 DIAGNOSIS — I7100 Dissection of unspecified site of aorta: Secondary | ICD-10-CM | POA: Insufficient documentation

## 2012-12-13 DIAGNOSIS — E785 Hyperlipidemia, unspecified: Secondary | ICD-10-CM | POA: Insufficient documentation

## 2012-12-13 DIAGNOSIS — E119 Type 2 diabetes mellitus without complications: Secondary | ICD-10-CM | POA: Insufficient documentation

## 2012-12-13 DIAGNOSIS — R413 Other amnesia: Secondary | ICD-10-CM | POA: Insufficient documentation

## 2012-12-13 DIAGNOSIS — F0280 Dementia in other diseases classified elsewhere without behavioral disturbance: Secondary | ICD-10-CM | POA: Insufficient documentation

## 2012-12-13 DIAGNOSIS — M1990 Unspecified osteoarthritis, unspecified site: Secondary | ICD-10-CM | POA: Insufficient documentation

## 2012-12-13 DIAGNOSIS — F0153 Vascular dementia, unspecified severity, with mood disturbance: Secondary | ICD-10-CM | POA: Insufficient documentation

## 2012-12-13 DIAGNOSIS — I519 Heart disease, unspecified: Secondary | ICD-10-CM | POA: Insufficient documentation

## 2012-12-13 DIAGNOSIS — R519 Headache, unspecified: Secondary | ICD-10-CM | POA: Insufficient documentation

## 2013-12-02 DIAGNOSIS — L910 Hypertrophic scar: Secondary | ICD-10-CM | POA: Insufficient documentation

## 2013-12-02 DIAGNOSIS — R221 Localized swelling, mass and lump, neck: Secondary | ICD-10-CM | POA: Insufficient documentation

## 2013-12-18 DIAGNOSIS — H409 Unspecified glaucoma: Secondary | ICD-10-CM | POA: Insufficient documentation

## 2015-01-19 DIAGNOSIS — Z8679 Personal history of other diseases of the circulatory system: Secondary | ICD-10-CM | POA: Insufficient documentation

## 2015-07-01 DIAGNOSIS — IMO0001 Reserved for inherently not codable concepts without codable children: Secondary | ICD-10-CM | POA: Insufficient documentation

## 2017-02-07 DIAGNOSIS — J9610 Chronic respiratory failure, unspecified whether with hypoxia or hypercapnia: Secondary | ICD-10-CM

## 2017-02-07 DIAGNOSIS — N3001 Acute cystitis with hematuria: Secondary | ICD-10-CM

## 2017-02-07 DIAGNOSIS — N179 Acute kidney failure, unspecified: Secondary | ICD-10-CM

## 2017-02-07 DIAGNOSIS — E1110 Type 2 diabetes mellitus with ketoacidosis without coma: Secondary | ICD-10-CM

## 2017-02-07 DIAGNOSIS — I5031 Acute diastolic (congestive) heart failure: Secondary | ICD-10-CM

## 2017-02-07 DIAGNOSIS — K859 Acute pancreatitis without necrosis or infection, unspecified: Secondary | ICD-10-CM

## 2017-02-07 DIAGNOSIS — E669 Obesity, unspecified: Secondary | ICD-10-CM | POA: Diagnosis not present

## 2017-02-08 DIAGNOSIS — N179 Acute kidney failure, unspecified: Secondary | ICD-10-CM | POA: Diagnosis not present

## 2017-02-08 DIAGNOSIS — I5031 Acute diastolic (congestive) heart failure: Secondary | ICD-10-CM | POA: Diagnosis not present

## 2017-02-08 DIAGNOSIS — K859 Acute pancreatitis without necrosis or infection, unspecified: Secondary | ICD-10-CM | POA: Diagnosis not present

## 2017-02-08 DIAGNOSIS — E1110 Type 2 diabetes mellitus with ketoacidosis without coma: Secondary | ICD-10-CM | POA: Diagnosis not present

## 2017-02-09 DIAGNOSIS — K859 Acute pancreatitis without necrosis or infection, unspecified: Secondary | ICD-10-CM | POA: Diagnosis not present

## 2017-02-09 DIAGNOSIS — N179 Acute kidney failure, unspecified: Secondary | ICD-10-CM | POA: Diagnosis not present

## 2017-02-09 DIAGNOSIS — E1110 Type 2 diabetes mellitus with ketoacidosis without coma: Secondary | ICD-10-CM | POA: Diagnosis not present

## 2017-02-09 DIAGNOSIS — I10 Essential (primary) hypertension: Secondary | ICD-10-CM | POA: Diagnosis not present

## 2017-02-09 DIAGNOSIS — I5031 Acute diastolic (congestive) heart failure: Secondary | ICD-10-CM | POA: Diagnosis not present

## 2017-02-10 DIAGNOSIS — I5031 Acute diastolic (congestive) heart failure: Secondary | ICD-10-CM | POA: Diagnosis not present

## 2017-02-10 DIAGNOSIS — I517 Cardiomegaly: Secondary | ICD-10-CM

## 2017-02-10 DIAGNOSIS — I10 Essential (primary) hypertension: Secondary | ICD-10-CM

## 2017-02-10 DIAGNOSIS — E1110 Type 2 diabetes mellitus with ketoacidosis without coma: Secondary | ICD-10-CM | POA: Diagnosis not present

## 2017-02-10 DIAGNOSIS — K859 Acute pancreatitis without necrosis or infection, unspecified: Secondary | ICD-10-CM | POA: Diagnosis not present

## 2017-02-10 DIAGNOSIS — N179 Acute kidney failure, unspecified: Secondary | ICD-10-CM | POA: Diagnosis not present

## 2017-03-14 DIAGNOSIS — R1032 Left lower quadrant pain: Secondary | ICD-10-CM | POA: Insufficient documentation

## 2020-09-24 DIAGNOSIS — H903 Sensorineural hearing loss, bilateral: Secondary | ICD-10-CM | POA: Diagnosis not present

## 2020-09-24 DIAGNOSIS — H6123 Impacted cerumen, bilateral: Secondary | ICD-10-CM | POA: Diagnosis not present

## 2020-09-24 DIAGNOSIS — J310 Chronic rhinitis: Secondary | ICD-10-CM | POA: Diagnosis not present

## 2020-09-24 DIAGNOSIS — J342 Deviated nasal septum: Secondary | ICD-10-CM | POA: Diagnosis not present

## 2020-09-24 DIAGNOSIS — J343 Hypertrophy of nasal turbinates: Secondary | ICD-10-CM | POA: Diagnosis not present

## 2020-09-29 ENCOUNTER — Ambulatory Visit: Payer: Medicare PPO | Admitting: Allergy & Immunology

## 2020-09-29 DIAGNOSIS — E119 Type 2 diabetes mellitus without complications: Secondary | ICD-10-CM | POA: Diagnosis not present

## 2020-09-29 DIAGNOSIS — F411 Generalized anxiety disorder: Secondary | ICD-10-CM | POA: Diagnosis not present

## 2020-09-29 DIAGNOSIS — J309 Allergic rhinitis, unspecified: Secondary | ICD-10-CM | POA: Diagnosis not present

## 2020-09-29 DIAGNOSIS — K219 Gastro-esophageal reflux disease without esophagitis: Secondary | ICD-10-CM | POA: Diagnosis not present

## 2020-09-29 DIAGNOSIS — I10 Essential (primary) hypertension: Secondary | ICD-10-CM | POA: Diagnosis not present

## 2020-09-29 DIAGNOSIS — Z8679 Personal history of other diseases of the circulatory system: Secondary | ICD-10-CM | POA: Diagnosis not present

## 2020-09-29 DIAGNOSIS — J449 Chronic obstructive pulmonary disease, unspecified: Secondary | ICD-10-CM | POA: Diagnosis not present

## 2020-09-29 DIAGNOSIS — Z952 Presence of prosthetic heart valve: Secondary | ICD-10-CM | POA: Diagnosis not present

## 2020-09-29 DIAGNOSIS — Z7951 Long term (current) use of inhaled steroids: Secondary | ICD-10-CM | POA: Diagnosis not present

## 2020-10-15 ENCOUNTER — Other Ambulatory Visit: Payer: Self-pay

## 2020-10-15 ENCOUNTER — Encounter: Payer: Self-pay | Admitting: Allergy & Immunology

## 2020-10-15 ENCOUNTER — Ambulatory Visit (INDEPENDENT_AMBULATORY_CARE_PROVIDER_SITE_OTHER): Payer: Medicare HMO | Admitting: Allergy & Immunology

## 2020-10-15 VITALS — BP 140/76 | HR 80 | Temp 98.6°F | Resp 16 | Ht 66.0 in | Wt 192.0 lb

## 2020-10-15 DIAGNOSIS — J3089 Other allergic rhinitis: Secondary | ICD-10-CM

## 2020-10-15 DIAGNOSIS — J302 Other seasonal allergic rhinitis: Secondary | ICD-10-CM | POA: Diagnosis not present

## 2020-10-15 DIAGNOSIS — J3489 Other specified disorders of nose and nasal sinuses: Secondary | ICD-10-CM | POA: Diagnosis not present

## 2020-10-15 DIAGNOSIS — R0602 Shortness of breath: Secondary | ICD-10-CM | POA: Diagnosis not present

## 2020-10-15 MED ORDER — TRIAMCINOLONE ACETONIDE 55 MCG/ACT NA AERO: 2.0000 | INHALATION_SPRAY | Freq: Every day | NASAL | 5 refills | Status: DC

## 2020-10-15 MED ORDER — LEVOCETIRIZINE DIHYDROCHLORIDE 5 MG PO TABS: 5.0000 mg | ORAL_TABLET | Freq: Two times a day (BID) | ORAL | 5 refills | Status: AC | PRN
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
## 2020-10-15 NOTE — Patient Instructions (Addendum)
1. Rhinorrhea - Testing today showed: ragweed, trees, molds, cat, and dust mite - Copy of testing results provided. - Consider allergy shots for long term control.  - Stop the current nose spray and start Nasacort one spray per nostril daily (samples provided). - Add on levocetirizine 5 mg daily. - Please collect mucous from your nose (enough to cover the bottom of the container) so we can send it for Beta-2 Transferrin. - This test will help [place] decide whether the runny nose is coming from a cerebrospinal fluid leak or just run of the mill mucous.  - I am doing this because your symptoms started after your nasal surgery.  - Bring the container of mucous to a [facility] tomorrow sometime.   2. Shortness of breath - Lung testing did not look great but this might be your baseline. - Continue with Trelegy one puff once daily. - Continue with albuterol as needed.    3. Return in about 6 months (around 04/14/2021).    Please inform us of any Emergency Department visits, hospitalizations, or changes in symptoms. Call us before going to the ED for breathing or allergy symptoms since we might be able to fit you in for a sick visit. Feel free to contact us anytime with any questions, problems, or concerns.  It was a pleasure to meet you and your lovely wife today!  Websites that have reliable patient information: 1. American Academy of Asthma, Allergy, and Immunology: www.aaaai.org 2. Food Allergy Research and Education (FARE): foodallergy.org 3. Mothers of Asthmatics: http://www.asthmacommunitynetwork.org 4. American College of Allergy, Asthma, and Immunology: www.acaai.org   COVID-19 Vaccine Information can be found at: [email or web address] For questions related to vaccine distribution or appointments, please email [email or web address] or call [phone].   We realize that you might be concerned about having an allergic reaction to the  COVID19 vaccines. To help with that concern, WE ARE OFFERING THE COVID19 VACCINES IN OUR OFFICE! Ask the front desk for dates!     "Like" [place] on Facebook and Instagram for our latest updates!      A healthy democracy works best when [employer] participate! Make sure you are registered to vote! If you have moved or changed any of your contact information, you will need to get this updated before voting!  In some cases, you MAY be able to register to vote online: [email or web address]    Reducing Pollen Exposure  The American Academy of Allergy, Asthma and Immunology suggests the following steps to reduce your exposure to pollen during allergy seasons.    Do not hang sheets or clothing out to dry; pollen may collect on these items. Do not mow lawns or spend time around freshly cut grass; mowing stirs up pollen. Keep windows closed at night.  Keep car windows closed while driving. Minimize morning activities outdoors, a time when pollen counts are usually at their highest. Stay indoors as much as possible when pollen counts or humidity is high and on windy days when pollen tends to remain in the air longer. Use air conditioning when possible.  Many air conditioners have filters that trap the pollen spores. Use a HEPA room air filter to remove pollen form the indoor air you breathe.  Control of Mold Allergen   Mold and fungi can grow on a variety of surfaces provided certain temperature and moisture conditions exist.  Outdoor molds grow on plants, decaying vegetation and soil.  The major outdoor mold, Alternaria and Cladosporium, are found in very high numbers during hot and  dry conditions.  Generally, a late Summer - Fall peak is seen for common outdoor fungal spores.  Rain will temporarily lower outdoor mold spore count, but counts rise rapidly when the rainy period ends.  The most important indoor molds are Aspergillus and Penicillium.  Dark, humid and poorly  ventilated basements are ideal sites for mold growth.  The next most common sites of mold growth are the bathroom and the kitchen.  Outdoor (Seasonal) Mold Control   Use air conditioning and keep windows closed Avoid exposure to decaying vegetation. Avoid leaf raking. Avoid grain handling. Consider wearing a face mask if working in moldy areas.    Indoor (Perennial) Mold Control    Maintain humidity below 50%. Clean washable surfaces with 5% bleach solution. Remove sources e.g. contaminated carpets.    Control of Dog or Cat Allergen  Avoidance is the best way to manage a dog or cat allergy. If you have a dog or cat and are allergic to dog or cats, consider removing the dog or cat from the home. If you have a dog or cat but don't want to find it a new home, or if your family wants a pet even though someone in the household is allergic, here are some strategies that may help keep symptoms at bay:  Keep the pet out of your bedroom and restrict it to only a few rooms. Be advised that keeping the dog or cat in only one room will not limit the allergens to that room. Don't pet, hug or kiss the dog or cat; if you do, wash your hands with soap and water. High-efficiency particulate air (HEPA) cleaners run continuously in a bedroom or living room can reduce allergen levels over time. Regular use of a high-efficiency vacuum cleaner or a central vacuum can reduce allergen levels. Giving your dog or cat a bath at least once a week can reduce airborne allergen.  Control of Dust Mite Allergen    Dust mites play a major role in allergic asthma and rhinitis.  They occur in environments with high humidity wherever human skin is found.  Dust mites absorb humidity from the atmosphere (ie, they do not drink) and feed on organic matter (including shed human and animal skin).  Dust mites are a microscopic type of insect that you cannot see with the naked eye.  High levels of dust mites have been  detected from mattresses, pillows, carpets, upholstered furniture, bed covers, clothes, soft toys and any woven material.  The principal allergen of the dust mite is found in its feces.  A gram of dust may contain 1,000 mites and 250,000 fecal particles.  Mite antigen is easily measured in the air during house cleaning activities.  Dust mites do not bite and do not cause harm to humans, other than by triggering allergies/asthma.    Ways to decrease your exposure to dust mites in your home:  Encase mattresses, box springs and pillows with a mite-impermeable barrier or cover   Wash sheets, blankets and drapes weekly in hot water (130 F) with detergent and dry them in a dryer on the hot setting.  Have the room cleaned frequently with a vacuum cleaner and a damp dust-mop.  For carpeting or rugs, vacuuming with a vacuum cleaner equipped with a high-efficiency particulate air (HEPA) filter.  The dust mite allergic individual should not be in a room which is being cleaned and should wait 1 hour after cleaning before going into the room. Do not sleep on upholstered  furniture (eg, couches).   If possible removing carpeting, upholstered furniture and drapery from the home is ideal.  Horizontal blinds should be eliminated in the rooms where the person spends the most time (bedroom, study, television room).  Washable vinyl, roller-type shades are optimal. Remove all non-washable stuffed toys from the bedroom.  Wash stuffed toys weekly like sheets and blankets above.   Reduce indoor humidity to less than 50%.  Inexpensive humidity monitors can be purchased at most hardware stores.  Do not use a humidifier as can make the problem worse and are not recommended.    Allergy Shots   Allergies are the result of a chain reaction that starts in the immune system. Your immune system controls how your body defends itself. For instance, if you have an allergy to pollen, your immune system identifies pollen as an invader or  allergen. Your immune system overreacts by producing antibodies called Immunoglobulin E (IgE). These antibodies travel to cells that release chemicals, causing an allergic reaction.  The concept behind allergy immunotherapy, whether it is received in the form of shots or tablets, is that the immune system can be desensitized to specific allergens that trigger allergy symptoms. Although it requires time and patience, the payback can be long-term relief.  How Do Allergy Shots Work?  Allergy shots work much like a vaccine. Your body responds to injected amounts of a particular allergen given in increasing doses, eventually developing a resistance and tolerance to it. Allergy shots can lead to decreased, minimal or no allergy symptoms.  There generally are two phases: build-up and maintenance. Build-up often ranges from three to six months and involves receiving injections with increasing amounts of the allergens. The shots are typically given once or twice a week, though more rapid build-up schedules are sometimes used.  The maintenance phase begins when the most effective dose is reached. This dose is different for each person, depending on how allergic you are and your response to the build-up injections. Once the maintenance dose is reached, there are longer periods between injections, typically two to four weeks.  Occasionally doctors give cortisone-type shots that can temporarily reduce allergy symptoms. These types of shots are different and should not be confused with allergy immunotherapy shots.  Who Can Be Treated with Allergy Shots?  Allergy shots may be a good treatment approach for people with allergic rhinitis (hay fever), allergic asthma, conjunctivitis (eye allergy) or stinging insect allergy.   Before deciding to begin allergy shots, you should consider:   The length of allergy season and the severity of your symptoms  Whether medications and/or changes to your environment can  control your symptoms  Your desire to avoid long-term medication use  Time: allergy immunotherapy requires a major time commitment  Cost: may vary depending on your insurance coverage  Allergy shots for children age 7 and older are effective and often well tolerated. They might prevent the onset of new allergen sensitivities or the progression to asthma.  Allergy shots are not started on patients who are pregnant but can be continued on patients who become pregnant while receiving them. In some patients with other medical conditions or who take certain common medications, allergy shots may be of risk. It is important to mention other medications you talk to your allergist.   When Will I Feel Better?  Some may experience decreased allergy symptoms during the build-up phase. For others, it may take as long as 12 months on the maintenance dose. If there is no improvement  after a year of maintenance, your allergist will discuss other treatment options with you.  If you aren't responding to allergy shots, it may be because there is not enough dose of the allergen in your vaccine or there are missing allergens that were not identified during your allergy testing. Other reasons could be that there are high levels of the allergen in your environment or major exposure to non-allergic triggers like tobacco smoke.  What Is the Length of Treatment?  Once the maintenance dose is reached, allergy shots are generally continued for three to five years. The decision to stop should be discussed with your allergist at that time. Some people may experience a permanent reduction of allergy symptoms. Others may relapse and a longer course of allergy shots can be considered.  What Are the Possible Reactions?  The two types of adverse reactions that can occur with allergy shots are local and systemic. Common local reactions include very mild redness and swelling at the injection site, which can happen immediately or  several hours after. A systemic reaction, which is less common, affects the entire body or a particular body system. They are usually mild and typically respond quickly to medications. Signs include increased allergy symptoms such as sneezing, a stuffy nose or hives.  Rarely, a serious systemic reaction called anaphylaxis can develop. Symptoms include swelling in the throat, wheezing, a feeling of tightness in the chest, nausea or dizziness. Most serious systemic reactions develop within 30 minutes of allergy shots. This is why it is strongly recommended you wait in your doctor's office for 30 minutes after your injections. Your allergist is trained to watch for reactions, and his or her staff is trained and equipped with the proper medications to identify and treat them.  Who Should Administer Allergy Shots?  The preferred location for receiving shots is your prescribing allergist's office. Injections can sometimes be given at another facility where the physician and staff are trained to recognize and treat reactions, and have received instructions by your prescribing allergist.

## 2020-10-17 ENCOUNTER — Encounter: Payer: Self-pay | Admitting: Allergy & Immunology

## 2020-10-19 ENCOUNTER — Telehealth: Payer: Self-pay | Admitting: *Deleted

## 2020-10-19 NOTE — Telephone Encounter (Signed)
PA has been submitted through CoverMyMeds for Levocetirizine and is currently pending approval/denial.  

## 2020-10-19 NOTE — Telephone Encounter (Signed)
PA has been approved for Levocetirizine. PA has been faxed to patients pharmacy, labeled, and placed in bulk scanning.  

## 2020-10-20 ENCOUNTER — Encounter: Payer: Self-pay | Admitting: Allergy & Immunology

## 2020-11-26 ENCOUNTER — Encounter (HOSPITAL_COMMUNITY): Payer: Self-pay | Admitting: Emergency Medicine

## 2020-11-26 ENCOUNTER — Emergency Department (HOSPITAL_COMMUNITY): Payer: Medicare HMO

## 2020-11-26 ENCOUNTER — Emergency Department (HOSPITAL_COMMUNITY)
Admission: EM | Admit: 2020-11-26 | Discharge: 2020-11-26 | Disposition: A | Discharge status: Home / Self Care | Payer: Medicare HMO | Attending: Emergency Medicine | Admitting: Emergency Medicine

## 2020-11-26 DIAGNOSIS — R000 Tachycardia, unspecified: Secondary | ICD-10-CM | POA: Diagnosis not present

## 2020-11-26 DIAGNOSIS — Z87891 Personal history of nicotine dependence: Secondary | ICD-10-CM | POA: Diagnosis not present

## 2020-11-26 DIAGNOSIS — R079 Chest pain, unspecified: Secondary | ICD-10-CM | POA: Diagnosis not present

## 2020-11-26 DIAGNOSIS — Z79899 Other long term (current) drug therapy: Secondary | ICD-10-CM | POA: Diagnosis not present

## 2020-11-26 DIAGNOSIS — R0602 Shortness of breath: Secondary | ICD-10-CM | POA: Insufficient documentation

## 2020-11-26 DIAGNOSIS — R42 Dizziness and giddiness: Secondary | ICD-10-CM | POA: Diagnosis not present

## 2020-11-26 DIAGNOSIS — K529 Noninfective gastroenteritis and colitis, unspecified: Secondary | ICD-10-CM | POA: Diagnosis not present

## 2020-11-26 DIAGNOSIS — N39498 Other specified urinary incontinence: Secondary | ICD-10-CM | POA: Diagnosis not present

## 2020-11-26 DIAGNOSIS — R1084 Generalized abdominal pain: Secondary | ICD-10-CM | POA: Diagnosis not present

## 2020-11-26 DIAGNOSIS — E1165 Type 2 diabetes mellitus with hyperglycemia: Secondary | ICD-10-CM | POA: Insufficient documentation

## 2020-11-26 DIAGNOSIS — R739 Hyperglycemia, unspecified: Secondary | ICD-10-CM

## 2020-11-26 DIAGNOSIS — R531 Weakness: Secondary | ICD-10-CM | POA: Diagnosis not present

## 2020-11-26 DIAGNOSIS — R630 Anorexia: Secondary | ICD-10-CM | POA: Diagnosis not present

## 2020-11-26 DIAGNOSIS — K8020 Calculus of gallbladder without cholecystitis without obstruction: Secondary | ICD-10-CM | POA: Diagnosis not present

## 2020-11-26 DIAGNOSIS — K5730 Diverticulosis of large intestine without perforation or abscess without bleeding: Secondary | ICD-10-CM | POA: Diagnosis not present

## 2020-11-26 DIAGNOSIS — Z7984 Long term (current) use of oral hypoglycemic drugs: Secondary | ICD-10-CM | POA: Diagnosis not present

## 2020-11-26 LAB — CBC WITH DIFFERENTIAL/PLATELET
Abs Immature Granulocytes: 0.06 10*3/uL (ref 0.00–0.07)
Basophils Absolute: 0 10*3/uL (ref 0.0–0.1)
Basophils Relative: 0 %
Eosinophils Absolute: 0.2 10*3/uL (ref 0.0–0.5)
Eosinophils Relative: 2 %
HCT: 44.8 % (ref 39.0–52.0)
Hemoglobin: 14.4 g/dL (ref 13.0–17.0)
Immature Granulocytes: 1 %
Lymphocytes Relative: 18 %
Lymphs Abs: 1.6 10*3/uL (ref 0.7–4.0)
MCH: 34.5 pg — ABNORMAL HIGH (ref 26.0–34.0)
MCHC: 32.1 g/dL (ref 30.0–36.0)
MCV: 107.4 fL — ABNORMAL HIGH (ref 80.0–100.0)
Monocytes Absolute: 0.7 10*3/uL (ref 0.1–1.0)
Monocytes Relative: 8 %
Neutro Abs: 6.2 10*3/uL (ref 1.7–7.7)
Neutrophils Relative %: 71 %
Platelets: 164 10*3/uL (ref 150–400)
RBC: 4.17 MIL/uL — ABNORMAL LOW (ref 4.22–5.81)
RDW: 14.4 % (ref 11.5–15.5)
WBC: 8.8 10*3/uL (ref 4.0–10.5)
nRBC: 0.3 % — ABNORMAL HIGH (ref 0.0–0.2)

## 2020-11-26 LAB — COMPREHENSIVE METABOLIC PANEL
ALT: 23 U/L (ref 0–44)
AST: 30 U/L (ref 15–41)
Albumin: 3.9 g/dL (ref 3.5–5.0)
Alkaline Phosphatase: 59 U/L (ref 38–126)
Anion gap: 8 (ref 5–15)
BUN: 28 mg/dL — ABNORMAL HIGH (ref 8–23)
CO2: 27 mmol/L (ref 22–32)
Calcium: 9.5 mg/dL (ref 8.9–10.3)
Chloride: 99 mmol/L (ref 98–111)
Creatinine, Ser: 1.88 mg/dL — ABNORMAL HIGH (ref 0.61–1.24)
GFR, Estimated: 37 mL/min — ABNORMAL LOW (ref >60)
Glucose, Bld: 377 mg/dL — ABNORMAL HIGH (ref 70–99)
Potassium: 5 mmol/L (ref 3.5–5.1)
Sodium: 134 mmol/L — ABNORMAL LOW (ref 135–145)
Total Bilirubin: 1.4 mg/dL — ABNORMAL HIGH (ref 0.3–1.2)
Total Protein: 8.2 g/dL — ABNORMAL HIGH (ref 6.5–8.1)

## 2020-11-26 LAB — I-STAT VENOUS BLOOD GAS, ED
Acid-Base Excess: 1 mmol/L (ref 0.0–2.0)
Bicarbonate: 28 mmol/L (ref 20.0–28.0)
Calcium, Ion: 1.2 mmol/L (ref 1.15–1.40)
HCT: 48 % (ref 39.0–52.0)
Hemoglobin: 16.3 g/dL (ref 13.0–17.0)
O2 Saturation: 33 %
Potassium: 5.1 mmol/L (ref 3.5–5.1)
Sodium: 137 mmol/L (ref 135–145)
TCO2: 29 mmol/L (ref 22–32)
pCO2, Ven: 50.4 mmHg (ref 44.0–60.0)
pH, Ven: 7.352 (ref 7.250–7.430)
pO2, Ven: 22 mmHg — CL (ref 32.0–45.0)

## 2020-11-26 LAB — URINALYSIS, ROUTINE W REFLEX MICROSCOPIC
Bacteria, UA: NONE SEEN
Bilirubin Urine: NEGATIVE
Glucose, UA: >=500 mg/dL — AB
Hgb urine dipstick: NEGATIVE
Ketones, ur: 20 mg/dL — AB
Leukocytes,Ua: NEGATIVE
Nitrite: NEGATIVE
Protein, ur: 30 mg/dL — AB
Specific Gravity, Urine: 1.037 — ABNORMAL HIGH (ref 1.005–1.030)
pH: 5 (ref 5.0–8.0)

## 2020-11-26 LAB — LIPASE, BLOOD: Lipase: 45 U/L (ref 11–51)

## 2020-11-26 LAB — CBG MONITORING, ED
Glucose-Capillary: 377 mg/dL — ABNORMAL HIGH (ref 70–99)
Glucose-Capillary: 322 mg/dL — ABNORMAL HIGH (ref 70–99)

## 2020-11-26 LAB — TROPONIN I (HIGH SENSITIVITY)
Troponin I (High Sensitivity): 195 ng/L (ref <18)
Troponin I (High Sensitivity): 132 ng/L (ref <18)

## 2020-11-26 MED ORDER — SODIUM CHLORIDE 0.9 % IV BOLUS
1000.0000 mL | Freq: Once | INTRAVENOUS | Status: AC
  Administered 2020-11-26: 1000 mL via INTRAVENOUS

## 2020-11-26 MED ORDER — IOHEXOL 350 MG/ML SOLN
80.0000 mL | Freq: Once | INTRAVENOUS | Status: AC | PRN
  Administered 2020-11-26: 80 mL via INTRAVENOUS

## 2020-11-26 NOTE — ED Notes (Signed)
Critical troponin level given to [name] [name]

## 2020-11-26 NOTE — ED Provider Notes (Signed)
[facility] EMERGENCY DEPARTMENT Provider Note   CSN: [id number] Arrival date & time: 11/26/20  1319     History Chief Complaint  Patient presents with   Hyperglycemia    [name] [name] is a 75 y.o. male.  [name] [name] is a 75yo male with significant history of aortic aneurysm and T2DM presents to the ED with elevated blood sugar and fatigue x 2-3 days. Pt is a poor historian, so wife at bedside helps provide the history. Pts wife noticed that he has been sleeping more and has tried to stay in bed for the past 3 days. He states he has been fatigued for longer than this, but it has worsened the past few days. Also mild generalized abdominal discomfort for the past few months that has been bothering him more at night. He admits to weakness, decreased appetite, polydipsia and polyuria. He denies CP (had reported epigastric pain and generalized back pain in triage), SOB, fever, recent sick contacts/illnesses. Pts wife is concerned as she has been getting blood glucose readings on him in the 400-500s over the past 48 hours. She state he takes metformin 1000mg  BID, but she does not believe that it is working for him. She states he does not routinely exercise or stick to a diabetic diet.       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  Social History   Tobacco Use   Smoking status: Former    Types: Cigarettes    Quit date: 03/24/2009    Years since quitting: 11.6   Smokeless tobacco: Never  Substance Use Topics   Alcohol use: No    Home Medications Prior to Admission medications   Medication Sig Start Date End Date Taking? Authorizing Provider  amLODipine (NORVASC) 10 MG tablet Take 10 mg by mouth daily. In the am     [provider]  amLODipine (NORVASC) 5 MG tablet Take 5 mg by mouth daily. At bedtime      [provider]  docusate sodium (COLACE) 100 MG capsule Take 200 mg by mouth 1 day or 1 dose.      [provider]  labetalol (NORMODYNE) 200 MG tablet Take 200 mg by mouth 2 (two) times daily. Takes 4 tabs bid     [provider]  levocetirizine (XYZAL) 5 MG tablet Take 1 tablet (5 mg total) by mouth 2 (two) times daily as needed for allergies. 10/15/20   10/17/20, MD  oxycodone (OXY-IR) 5 MG capsule Take 5 mg by mouth every 4 (four) hours as needed. 1 or 2 tabs q4-6hrs  prn     [provider]  pantoprazole (PROTONIX) 40 MG tablet Take 40 mg by mouth 2 (two) times daily.      [provider]  triamcinolone (NASACORT) 55 MCG/ACT AERO nasal inhaler Place 2 sprays into the nose daily. 10/15/20   Alfonse Spruce, MD    Allergies    Patient has no known allergies.  Review of Systems   Review of Systems  Constitutional:  Positive for fatigue. Negative for fever.  Respiratory:  Negative for shortness of breath.   Cardiovascular:  Negative for chest pain.  Gastrointestinal:  Negative for abdominal pain.  Endocrine: Positive for polyuria.  Genitourinary:  Positive for frequency and urgency. Negative for dysuria.  Musculoskeletal:  Positive for back pain.   Skin:  Negative for rash and wound.  Allergic/Immunologic: Positive for immunocompromised state.  Neurological:  Positive for weakness.  Hematological:  Does not bruise/bleed easily.  Psychiatric/Behavioral:  Negative for confusion.   All other systems reviewed and are negative.  Physical Exam Updated Vital Signs BP (!) 141/84   Pulse 87   Temp 98 F (36.7 C) (Oral)   Resp (!) 22   SpO2 94%   Physical Exam Vitals and nursing note reviewed.  Constitutional:      Appearance: Normal appearance.  HENT:     Head: Normocephalic and atraumatic.     Nose: Nose normal.     Mouth/Throat:     Mouth: Mucous membranes are dry.  Eyes:     Conjunctiva/sclera: Conjunctivae normal.  Cardiovascular:     Rate and Rhythm: Normal rate and regular rhythm.     Pulses: Normal pulses.     Heart sounds: Normal heart sounds.  Pulmonary:     Effort: Pulmonary effort is normal.     Breath sounds: Normal breath sounds.  Abdominal:     Palpations: Abdomen is soft.     Tenderness: There is abdominal tenderness.  Musculoskeletal:     Cervical back: Neck supple.     Right lower leg: No edema.     Left lower leg: No edema.  Skin:    General: Skin is warm and dry.     Findings: No erythema or rash.  Neurological:     Mental Status: He is alert and oriented to person, place, and time.     Sensory: No sensory deficit.  Psychiatric:        Behavior: Behavior normal.    ED Results / Procedures / Treatments   Labs (all labs ordered are listed, but only abnormal results are displayed) Labs Reviewed  CBC WITH DIFFERENTIAL/PLATELET - Abnormal; Notable for the following components:      Result Value   RBC 4.17 (*)    MCV 107.4 (*)    MCH 34.5 (*)    nRBC 0.3 (*)    All other components within normal limits  COMPREHENSIVE METABOLIC PANEL - Abnormal; Notable for the following components:   Sodium 134 (*)    Glucose, Bld 377 (*)    BUN 28 (*)    Creatinine, Ser 1.88 (*)    Total Protein 8.2 (*)     Total Bilirubin 1.4 (*)    GFR, Estimated 37 (*)    All other components within normal limits  CBG MONITORING, ED - Abnormal; Notable for the following components:   Glucose-Capillary 377 (*)    All other components within normal limits  I-STAT VENOUS BLOOD GAS, ED - Abnormal; Notable for the following components:   pO2, Ven  22.0 (*)    All other components within normal limits  TROPONIN I (HIGH SENSITIVITY) - Abnormal; Notable for the following components:   Troponin I (High Sensitivity) 195 (*)    All other components within normal limits  LIPASE, BLOOD  URINALYSIS, ROUTINE W REFLEX MICROSCOPIC  TROPONIN I (HIGH SENSITIVITY)    EKG EKG Interpretation  Date/Time:  Thursday November 26 2020 14:57:20 EDT Ventricular Rate:  102 PR Interval:  154 QRS Duration: 68 QT Interval:  330 QTC Calculation: 430 R Axis:   -14 Text Interpretation: Sinus tachycardia Otherwise normal ECG Confirmed by Rolan Bucco [phone]) on 11/26/2020 5:15:04 PM  Radiology DG Chest 2 View  Result Date: 11/26/2020 CLINICAL DATA:  Weakness and shortness of breath.  Hyperglycemia. EXAM: CHEST - 2 VIEW COMPARISON:  03/26/2009 FINDINGS: Previous median sternotomy and CABG. Previous aortic stent graft with multiple embolization coils anterior to the main body of the stent graft. There is scarring or atelectasis at the left lung base. The lungs are otherwise clear. No evidence of edema or effusion. No acute bone finding. IMPRESSION: Atelectasis or scarring at the left lung base. Previous aortic stent graft with embolization augmentation. Electronically Signed   By: Paulina Fusi M.D.   On: 11/26/2020 15:31    Procedures Procedures   Medications Ordered in ED Medications  sodium chloride 0.9 % bolus 1,000 mL (has no administration in time range)    ED Course  I have reviewed the triage vital signs and the nursing notes.  Pertinent labs & imaging results that were available during my care of the patient were  reviewed by me and considered in my medical decision making (see chart for details).  Clinical Course as of 11/26/20 1855  Thu Nov 26, 2020  2558 75 year old male with known type A and type B dissection with prior surgery, last monitored at [facility] in 10/2017 with stable appearing CT (per chart review), with plan for repeat in 18 months for monitoring.  Patient here today with elevated blood sugar, polyuria, polydipsia and generalized weakness and fatigue.  Has generalized abdominal discomfort which has been ongoing for months but may be worse lately although patient minimizes this today.  He has equal pulses in all extremities.  His blood glucose level is elevated at 377 on chemistry, creatinine elevated at 1.88 today, last time, with [facility] in care everywhere at 1.3 2019. Patient is not anemic with hemoglobin of 14.4 today.  Plan is for CTA chest, abdomen, pelvis to evaluate for changes in his dissection versus no acute abdominal findings.  IV fluids have been ordered to treat his hyperglycemia.  Pending urinalysis Care signed out to Dr. Fredderick Phenix, ER attending at change of shift. [LM]  1854 Initial troponin is elevated at 195.  EKG without ischemic changes, repeat troponin and EKG pending. [LM]    Clinical Course User Index [LM] Alden Hipp   MDM Rules/Calculators/A&P                           Final Clinical Impression(s) / ED Diagnoses Final diagnoses:  Hyperglycemia  Generalized abdominal pain    Rx / DC Orders ED Discharge Orders     None        Jeannie Fend, PA-C 11/26/20 1855    Rolan Bucco, MD 11/26/20 Babette Relic    Rolan Bucco, MD 11/26/20 2317

## 2020-11-26 NOTE — Consult Note (Addendum)
Hospital Consult    Reason for Consult: History of type A aortic dissection s/p repair and interval TEVAR, concern for new aneurysmal growth Requesting Physician: Dr. Tamera Punt MRN #:  [id number]  History of Present Illness: This is a 75 y.o. male followed by [facility] CT surgery for ongoing aortic surveillance. 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 He underwent IR coiling of L bronchial and multiple intercostal arteries in 07/2015 for a T2 endoleak.  Patient presents to the emergency department today with a blood glucose level in the 500s.  He has chronic back pain but has also appreciated new onset pain with urination and diarrhea.  A CT scan was ordered due to his chronic back pain and known history of aortic dissection.  On exam floor was in the room with his wife.  He was doing well and smiling.  He stated his back pain has been chronic and his main concern is pain with urination and diarrhea.  He stated he is followed by Dr. Ysidro Evert at [place] [facility], with plans to see him next week.  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 Authorizing Provider  amLODipine (NORVASC) 10 MG tablet Take 10 mg by mouth daily. In the am     [provider]  amLODipine (NORVASC) 5 MG tablet Take 5 mg by mouth daily. At bedtime      [provider]  docusate sodium (COLACE) 100 MG capsule Take 200 mg by mouth 1 day or 1 dose.      [provider]  labetalol (NORMODYNE) 200 MG tablet Take 200 mg by mouth 2 (two) times daily. Takes 4 tabs bid     [provider]  levocetirizine (XYZAL) 5 MG tablet Take 1 tablet (5 mg total) by mouth 2 (two) times daily as needed for allergies. 10/15/20   Valentina Shaggy, MD  oxycodone (OXY-IR) 5 MG capsule Take 5 mg by mouth every 4 (four) hours as needed. 1 or 2 tabs q4-6hrs prn     [provider]  pantoprazole (PROTONIX) 40 MG tablet Take 40 mg by mouth 2 (two) times daily.      [provider]  triamcinolone (NASACORT) 55 MCG/ACT AERO nasal inhaler Place 2 sprays into the nose daily. 10/15/20   Valentina Shaggy, MD    Social History   Socioeconomic History   Marital  status: Married    Spouse name: Not on file   Number of children: Not on file   Years of education: Not on file   Highest education level: Not on file  Occupational History   Not on file  Tobacco Use   Smoking status: Former    Types: Cigarettes    Quit date: 03/24/2009    Years since quitting: 11.6   Smokeless tobacco: Never  Substance and Sexual Activity   Alcohol use: No   Drug use: Not on file   Sexual activity: Not on file  Other Topics Concern   Not on file  Social History Narrative   ** Merged History Encounter **       Social Determinants of Health   Financial Resource Strain: Not on file  Food Insecurity: Not on file   Transportation Needs: Not on file  Physical Activity: Not on file  Stress: Not on file  Social Connections: Not on file  Intimate Partner Violence: Not on file     No family history on file.  ROS: Otherwise negative unless mentioned in HPI  Physical Examination  Vitals:   11/26/20 2130 11/26/20 2200  BP: (!) 154/85 (!) 149/85  Pulse: 77 74  Resp: 18 15  Temp:    SpO2: 95% 94%   There is no height or weight on file to calculate BMI.  General:  WDWN in NAD Gait: Not observed HENT: WNL, normocephalic Pulmonary: normal non-labored breathing, without Rales, rhonchi,  wheezing Cardiac: regular,  Abdomen: soft, NT/ND, no masses Skin: without rashes Extremities: without ischemic changes, without Gangrene , without cellulitis; without open wounds;  Musculoskeletal: no muscle wasting or atrophy, back pain to spinal palpation  Neurologic: A&O X 3;  No focal weakness or paresthesias are detected; speech is fluent/normal Psychiatric:  The pt has Normal affect. Lymph:  Unremarkable  CBC    Component Value Date/Time   WBC 8.8 11/26/2020 1505   RBC 4.17 (L) 11/26/2020 1505   HGB 16.3 11/26/2020 1517   HCT 48.0 11/26/2020 1517   PLT 164 11/26/2020 1505   MCV 107.4 (H) 11/26/2020 1505   MCH 34.5 (H) 11/26/2020 1505   MCHC 32.1 11/26/2020 1505   RDW 14.4 11/26/2020 1505   LYMPHSABS 1.6 11/26/2020 1505   MONOABS 0.7 11/26/2020 1505   EOSABS 0.2 11/26/2020 1505   BASOSABS 0.0 11/26/2020 1505    BMET    Component Value Date/Time   NA 137 11/26/2020 1517   K 5.1 11/26/2020 1517   CL 99 11/26/2020 1505   CO2 27 11/26/2020 1505   GLUCOSE 377 (H) 11/26/2020 1505   BUN 28 (H) 11/26/2020 1505   CREATININE 1.88 (H) 11/26/2020 1505   CALCIUM 9.5 11/26/2020 1505   GFRNONAA 37 (L) 11/26/2020 1505   GFRAA  03/31/2009 1100    >60        The eGFR has been calculated using the MDRD equation. This calculation has not been validated in all clinical situations. eGFR's  persistently <60 mL/min signify possible Chronic Kidney Disease.    COAGS: Lab Results  Component Value Date   INR 1.07 03/24/2009   INR 1.1 06/22/2007     Non-Invasive Vascular Imaging:      ASSESSMENT/PLAN: This is a 75 y.o. male followed by [facility] CT surgery for ongoing aortic surveillance. 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 He underwent IR coiling of L bronchial and multiple intercostal arteries in 07/2015 for a T2 endoleak.   Unfortunately, I do not have imaging from [facility] that was last completed on 04/06/2020. In comparing current imaging to our last, which was in 2011, there has been aneurysmal dilatation of the descending thoracic aorta, most notably immediately proximal to the celiac artery.  This is measuring 5.3 cm from 3.4 cm.  I do not know this interval change, however there are no concerning signs at this area of aneurysmal dilatation.  Furthermore, Chord says that he is aware of this area and it is being followed at [facility].  The patient's back pain is chronic, and is unchanged.  His major complaint is abdominal pain with urination, and diarrhea-these are not associated with his already pathology.  Patient does not have acute aortic syndrome, no need for urgent vascular repair.  Planned follow-up already scheduled at [facility] next week.   Cassandria Santee MD MS [facility] [phone] 11/26/2020  10:26 PM

## 2020-11-26 NOTE — ED Notes (Signed)
Patient has not yet been placed in room 004.

## 2020-11-26 NOTE — ED Notes (Signed)
Pt assisted to stand at bedside to use urinal. Pt denies any complaints of pain at this time. No acute changes noted. Will continue to monitor.

## 2020-11-26 NOTE — ED Triage Notes (Signed)
Patient [name] from PCP for evaluation of hyperglycemia, CBG 425 today. Patient alert, oriented, and in no apparent distress at this time.

## 2020-11-26 NOTE — Discharge Instructions (Addendum)
Follow-up with your doctor with [facility] physicians regarding your elevated blood sugars.  You need to watch her diet and maintain a low-carb diet.  Follow-up with your cardiothoracic surgeon at [facility] regarding your dissection.  Return here as needed if you have any worsening symptoms.

## 2020-11-26 NOTE — ED Notes (Signed)
Pt given cranberry juice, sandwich bag per request. Pt asking about the plan of care. MD notified. Wife states she is tired and wants to go home. Wife informed to wait until we know a plan. No acute changes noted. Pt complaining of pain in back from laying on the stretcher. Will continue to monitor.

## 2020-11-26 NOTE — ED Provider Notes (Addendum)
Emergency Medicine Provider Triage Evaluation Note  [name] [name] , a 75 y.o. male  was evaluated in triage.  Pt complains of weakness and abnormal lab.  Wife took patient to the doctor who was told he had some abnormal laboratory error not sure what it was.  Wife states she had brought him initially due to elevated blood sugars into the 500s at home.  Patient has had dry mouth however is having polyuria.  Some CP, which wife states he has had previously. Some epigastric pain, generalized back pain. Known Type A and B dissection.  Feels generally weak.  Not eating at home, sleeping more.  Review of Systems  Positive: Weakness, change in appetite, hyperglycemia, abnormal lab Negative: Chest pain, back pain, abdominal pain  Physical Exam  BP (!) 145/97 (BP Location: Left Arm)   Pulse 93   Temp 98 F (36.7 C) (Oral)   Resp 18   SpO2 96%  Gen:   Awake, no distress   Resp:  Normal effort  Cardiac: 2+ Radial pulses Bl MSK:   Moves extremities without difficulty  Other:    Medical Decision Making  Medically screening exam initiated at 2:57 PM.  Appropriate orders placed.  [name] [name] was informed that the remainder of the evaluation will be completed by another provider, this initial triage assessment does not replace that evaluation, and the importance of remaining in the ED until their evaluation is complete.  Weakness, abnormal lab, CP< ABD PAIN. Known Type A and B dissection  Hemodynamically stable, work-up started      Lanecia Sliva A, PA-C 11/26/20 1503    Margarita Grizzle, MD 11/27/20 1148

## 2020-11-26 NOTE — ED Provider Notes (Signed)
Nursing notified me that patient has troponin 195  Discussed with charge nurse Ladona Ridgel patient needs room in back   [place], Mads Borgmeyer A, PA-C 11/26/20 1621    Margarita Grizzle, MD 11/27/20 1148

## 2020-11-27 ENCOUNTER — Encounter: Payer: Self-pay | Admitting: Internal Medicine

## 2020-11-30 DIAGNOSIS — R739 Hyperglycemia, unspecified: Secondary | ICD-10-CM | POA: Diagnosis not present

## 2020-11-30 DIAGNOSIS — E1165 Type 2 diabetes mellitus with hyperglycemia: Secondary | ICD-10-CM | POA: Diagnosis not present

## 2020-11-30 DIAGNOSIS — J449 Chronic obstructive pulmonary disease, unspecified: Secondary | ICD-10-CM | POA: Diagnosis not present

## 2020-11-30 DIAGNOSIS — R9439 Abnormal result of other cardiovascular function study: Secondary | ICD-10-CM | POA: Diagnosis not present

## 2020-11-30 DIAGNOSIS — E871 Hypo-osmolality and hyponatremia: Secondary | ICD-10-CM | POA: Diagnosis not present

## 2020-11-30 DIAGNOSIS — Z8679 Personal history of other diseases of the circulatory system: Secondary | ICD-10-CM | POA: Diagnosis not present

## 2020-11-30 DIAGNOSIS — Z87891 Personal history of nicotine dependence: Secondary | ICD-10-CM | POA: Diagnosis not present

## 2020-11-30 DIAGNOSIS — R9430 Abnormal result of cardiovascular function study, unspecified: Secondary | ICD-10-CM | POA: Diagnosis not present

## 2020-11-30 DIAGNOSIS — F39 Unspecified mood [affective] disorder: Secondary | ICD-10-CM | POA: Diagnosis not present

## 2020-11-30 DIAGNOSIS — I7100 Dissection of unspecified site of aorta: Secondary | ICD-10-CM | POA: Diagnosis not present

## 2020-11-30 DIAGNOSIS — D696 Thrombocytopenia, unspecified: Secondary | ICD-10-CM | POA: Diagnosis not present

## 2020-11-30 DIAGNOSIS — I214 Non-ST elevation (NSTEMI) myocardial infarction: Secondary | ICD-10-CM | POA: Diagnosis not present

## 2020-11-30 DIAGNOSIS — J9811 Atelectasis: Secondary | ICD-10-CM | POA: Diagnosis not present

## 2020-11-30 DIAGNOSIS — Z9889 Other specified postprocedural states: Secondary | ICD-10-CM | POA: Diagnosis not present

## 2020-11-30 DIAGNOSIS — I5A Non-ischemic myocardial injury (non-traumatic): Secondary | ICD-10-CM | POA: Diagnosis not present

## 2020-11-30 DIAGNOSIS — E1151 Type 2 diabetes mellitus with diabetic peripheral angiopathy without gangrene: Secondary | ICD-10-CM | POA: Diagnosis not present

## 2020-11-30 DIAGNOSIS — I10 Essential (primary) hypertension: Secondary | ICD-10-CM | POA: Diagnosis not present

## 2020-11-30 DIAGNOSIS — R079 Chest pain, unspecified: Secondary | ICD-10-CM | POA: Diagnosis not present

## 2020-11-30 DIAGNOSIS — E785 Hyperlipidemia, unspecified: Secondary | ICD-10-CM | POA: Diagnosis not present

## 2020-11-30 DIAGNOSIS — N179 Acute kidney failure, unspecified: Secondary | ICD-10-CM | POA: Diagnosis not present

## 2020-11-30 DIAGNOSIS — R0789 Other chest pain: Secondary | ICD-10-CM | POA: Diagnosis not present

## 2020-12-01 DIAGNOSIS — R079 Chest pain, unspecified: Secondary | ICD-10-CM | POA: Diagnosis not present

## 2020-12-03 DIAGNOSIS — E1165 Type 2 diabetes mellitus with hyperglycemia: Secondary | ICD-10-CM | POA: Diagnosis not present

## 2020-12-03 DIAGNOSIS — I10 Essential (primary) hypertension: Secondary | ICD-10-CM | POA: Diagnosis not present

## 2020-12-03 DIAGNOSIS — R9439 Abnormal result of other cardiovascular function study: Secondary | ICD-10-CM | POA: Diagnosis not present

## 2020-12-03 DIAGNOSIS — E785 Hyperlipidemia, unspecified: Secondary | ICD-10-CM | POA: Diagnosis not present

## 2020-12-03 DIAGNOSIS — R0789 Other chest pain: Secondary | ICD-10-CM | POA: Diagnosis not present

## 2020-12-03 DIAGNOSIS — Z8679 Personal history of other diseases of the circulatory system: Secondary | ICD-10-CM | POA: Diagnosis not present

## 2020-12-03 DIAGNOSIS — I214 Non-ST elevation (NSTEMI) myocardial infarction: Secondary | ICD-10-CM | POA: Diagnosis not present

## 2020-12-05 DIAGNOSIS — G3184 Mild cognitive impairment, so stated: Secondary | ICD-10-CM | POA: Diagnosis not present

## 2020-12-05 DIAGNOSIS — M47816 Spondylosis without myelopathy or radiculopathy, lumbar region: Secondary | ICD-10-CM | POA: Diagnosis not present

## 2020-12-05 DIAGNOSIS — I2510 Atherosclerotic heart disease of native coronary artery without angina pectoris: Secondary | ICD-10-CM | POA: Diagnosis not present

## 2020-12-05 DIAGNOSIS — E1151 Type 2 diabetes mellitus with diabetic peripheral angiopathy without gangrene: Secondary | ICD-10-CM | POA: Diagnosis not present

## 2020-12-05 DIAGNOSIS — I214 Non-ST elevation (NSTEMI) myocardial infarction: Secondary | ICD-10-CM | POA: Diagnosis not present

## 2020-12-05 DIAGNOSIS — E1165 Type 2 diabetes mellitus with hyperglycemia: Secondary | ICD-10-CM | POA: Diagnosis not present

## 2020-12-05 DIAGNOSIS — H409 Unspecified glaucoma: Secondary | ICD-10-CM | POA: Diagnosis not present

## 2020-12-05 DIAGNOSIS — G8929 Other chronic pain: Secondary | ICD-10-CM | POA: Diagnosis not present

## 2020-12-05 DIAGNOSIS — I119 Hypertensive heart disease without heart failure: Secondary | ICD-10-CM | POA: Diagnosis not present

## 2020-12-07 ENCOUNTER — Telehealth: Payer: Self-pay | Admitting: Allergy & Immunology

## 2020-12-07 NOTE — Telephone Encounter (Signed)
[name] (son) called in and states that [name] [name] was rushed to [facility]  [name] states the doctors with [facility] are wanting to confirm that [name] still needs to be on Azelastine?  Patient states with his history and medication history is it ok to start back on it?  [name] wants you to look over his [facility] notes. Please advise.

## 2020-12-08 ENCOUNTER — Other Ambulatory Visit: Payer: Self-pay | Admitting: *Deleted

## 2020-12-08 MED ORDER — TRIAMCINOLONE ACETONIDE 55 MCG/ACT NA AERO: 1.0000 | INHALATION_SPRAY | Freq: Every day | NASAL | 1 refills | Status: DC

## 2020-12-08 NOTE — Telephone Encounter (Signed)
Called and spoke with patients son [name] [name] and advised. Patients son was not aware that he was on Nasacort. I advised that per Dr. Ellouise Newer last office note he did want him to use Nasacort and a sample was provided. [name] [name] asked if prescription could be sent in. I did advised the possibility of it not being covered since it is an over the counter medication. [name] [name] verbalized understanding. A prescription for Nasacort has been sent in to the preferred pharmacy.

## 2020-12-08 NOTE — Telephone Encounter (Signed)
He can stop the Astelin. We can just continue with the Nasacort.  Thanks, Malachi Bonds, MD [facility]

## 2020-12-10 DIAGNOSIS — E1165 Type 2 diabetes mellitus with hyperglycemia: Secondary | ICD-10-CM | POA: Diagnosis not present

## 2020-12-10 DIAGNOSIS — E7849 Other hyperlipidemia: Secondary | ICD-10-CM | POA: Diagnosis not present

## 2020-12-10 DIAGNOSIS — R778 Other specified abnormalities of plasma proteins: Secondary | ICD-10-CM | POA: Diagnosis not present

## 2020-12-10 DIAGNOSIS — R9439 Abnormal result of other cardiovascular function study: Secondary | ICD-10-CM | POA: Diagnosis not present

## 2020-12-10 DIAGNOSIS — E038 Other specified hypothyroidism: Secondary | ICD-10-CM | POA: Diagnosis not present

## 2020-12-10 DIAGNOSIS — Z794 Long term (current) use of insulin: Secondary | ICD-10-CM | POA: Diagnosis not present

## 2020-12-10 DIAGNOSIS — N179 Acute kidney failure, unspecified: Secondary | ICD-10-CM | POA: Diagnosis not present

## 2020-12-10 DIAGNOSIS — I10 Essential (primary) hypertension: Secondary | ICD-10-CM | POA: Diagnosis not present

## 2020-12-21 DIAGNOSIS — R0982 Postnasal drip: Secondary | ICD-10-CM | POA: Diagnosis not present

## 2020-12-21 DIAGNOSIS — H6123 Impacted cerumen, bilateral: Secondary | ICD-10-CM | POA: Diagnosis not present

## 2020-12-21 DIAGNOSIS — J310 Chronic rhinitis: Secondary | ICD-10-CM | POA: Diagnosis not present

## 2020-12-23 DIAGNOSIS — N390 Urinary tract infection, site not specified: Secondary | ICD-10-CM | POA: Diagnosis not present

## 2021-01-04 ENCOUNTER — Ambulatory Visit (INDEPENDENT_AMBULATORY_CARE_PROVIDER_SITE_OTHER): Payer: Medicare HMO | Admitting: Podiatry

## 2021-01-04 ENCOUNTER — Other Ambulatory Visit: Payer: Self-pay

## 2021-01-04 ENCOUNTER — Encounter: Payer: Self-pay | Admitting: Podiatry

## 2021-01-04 DIAGNOSIS — B351 Tinea unguium: Secondary | ICD-10-CM

## 2021-01-04 DIAGNOSIS — Z862 Personal history of diseases of the blood and blood-forming organs and certain disorders involving the immune mechanism: Secondary | ICD-10-CM

## 2021-01-04 DIAGNOSIS — M79674 Pain in right toe(s): Secondary | ICD-10-CM

## 2021-01-04 DIAGNOSIS — M79675 Pain in left toe(s): Secondary | ICD-10-CM

## 2021-01-04 NOTE — Progress Notes (Signed)
  Subjective:  Patient ID: [name] [name], male    DOB: May 05, 1945,   MRN: [id number]  Chief Complaint  Patient presents with   Nail Problem    Nail trim     75 y.o. male presents for concern of thickened elongated and painful toenails. Relates he is unable to trim himself. Is on blood thinners due to aortic dissection . Denies any other pedal complaints. Denies n/v/f/c.   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 CFT <3 seconds. Normal hair growth on digits. No edema.  Skin. No lacerations or abrasions bilateral feet. Nails 1-5 are thickened discolored and elongated with subungual debris. Third and fourth insterspaces macerated bilateral. Musculoskeletal: MMT 5/5 bilateral lower extremities in DF, PF, Inversion and Eversion. Deceased ROM in DF of ankle joint.  Neurological: Sensation intact to light touch.   Assessment:   1. Hx of blood coagulation disorder   2. Onychomycosis   3. Pain due to onychomycosis of toenails of both feet      Plan:  Patient was evaluated and treated and all questions answered. -Discussed and educated patient on foot care, especially with  regards to the vascular, neurological and musculoskeletal systems.  -Discussed supportive shoes at all times and checking feet regularly.  -Mechanically debrided all nails 1-5 bilateral using sterile nail nipper and filed with dremel without incident  -Answered all patient  questions -Instructed to apply betadine in 3rd and 4th interspaced due to maceration.  -Patient to return  in 3 months for at risk foot care -Patient advised to call the office if any problems or questions arise in the meantime.   Louann Sjogren, DPM

## 2021-01-11 DIAGNOSIS — E7800 Pure hypercholesterolemia, unspecified: Secondary | ICD-10-CM | POA: Diagnosis not present

## 2021-01-11 DIAGNOSIS — I7100 Dissection of unspecified site of aorta: Secondary | ICD-10-CM | POA: Diagnosis not present

## 2021-01-11 DIAGNOSIS — I10 Essential (primary) hypertension: Secondary | ICD-10-CM | POA: Diagnosis not present

## 2021-01-27 DIAGNOSIS — E1165 Type 2 diabetes mellitus with hyperglycemia: Secondary | ICD-10-CM | POA: Diagnosis not present

## 2021-01-29 DIAGNOSIS — F3341 Major depressive disorder, recurrent, in partial remission: Secondary | ICD-10-CM | POA: Diagnosis not present

## 2021-01-29 DIAGNOSIS — F4312 Post-traumatic stress disorder, chronic: Secondary | ICD-10-CM | POA: Diagnosis not present

## 2021-03-12 DIAGNOSIS — E039 Hypothyroidism, unspecified: Secondary | ICD-10-CM | POA: Diagnosis not present

## 2021-03-12 DIAGNOSIS — Z794 Long term (current) use of insulin: Secondary | ICD-10-CM | POA: Diagnosis not present

## 2021-03-12 DIAGNOSIS — N183 Chronic kidney disease, stage 3 unspecified: Secondary | ICD-10-CM | POA: Diagnosis not present

## 2021-03-12 DIAGNOSIS — E7800 Pure hypercholesterolemia, unspecified: Secondary | ICD-10-CM | POA: Diagnosis not present

## 2021-03-12 DIAGNOSIS — I10 Essential (primary) hypertension: Secondary | ICD-10-CM | POA: Diagnosis not present

## 2021-03-12 DIAGNOSIS — E1122 Type 2 diabetes mellitus with diabetic chronic kidney disease: Secondary | ICD-10-CM | POA: Diagnosis not present

## 2021-03-12 DIAGNOSIS — Z7984 Long term (current) use of oral hypoglycemic drugs: Secondary | ICD-10-CM | POA: Diagnosis not present

## 2021-03-22 DIAGNOSIS — H6123 Impacted cerumen, bilateral: Secondary | ICD-10-CM | POA: Diagnosis not present

## 2021-03-22 DIAGNOSIS — R0982 Postnasal drip: Secondary | ICD-10-CM | POA: Diagnosis not present

## 2021-03-22 DIAGNOSIS — J310 Chronic rhinitis: Secondary | ICD-10-CM | POA: Diagnosis not present

## 2021-03-23 DIAGNOSIS — G894 Chronic pain syndrome: Secondary | ICD-10-CM | POA: Diagnosis not present

## 2021-03-23 DIAGNOSIS — N183 Chronic kidney disease, stage 3 unspecified: Secondary | ICD-10-CM | POA: Diagnosis not present

## 2021-03-23 DIAGNOSIS — R630 Anorexia: Secondary | ICD-10-CM | POA: Diagnosis not present

## 2021-03-23 DIAGNOSIS — J449 Chronic obstructive pulmonary disease, unspecified: Secondary | ICD-10-CM | POA: Diagnosis not present

## 2021-03-23 DIAGNOSIS — I10 Essential (primary) hypertension: Secondary | ICD-10-CM | POA: Diagnosis not present

## 2021-03-23 DIAGNOSIS — E1122 Type 2 diabetes mellitus with diabetic chronic kidney disease: Secondary | ICD-10-CM | POA: Diagnosis not present

## 2021-03-23 DIAGNOSIS — M549 Dorsalgia, unspecified: Secondary | ICD-10-CM | POA: Diagnosis not present

## 2021-03-23 DIAGNOSIS — I2510 Atherosclerotic heart disease of native coronary artery without angina pectoris: Secondary | ICD-10-CM | POA: Diagnosis not present

## 2021-03-23 DIAGNOSIS — E1140 Type 2 diabetes mellitus with diabetic neuropathy, unspecified: Secondary | ICD-10-CM | POA: Diagnosis not present

## 2021-04-05 ENCOUNTER — Ambulatory Visit: Payer: Medicare HMO | Admitting: Podiatry

## 2021-04-14 ENCOUNTER — Ambulatory Visit (INDEPENDENT_AMBULATORY_CARE_PROVIDER_SITE_OTHER): Payer: Medicare HMO | Admitting: Podiatry

## 2021-04-14 ENCOUNTER — Other Ambulatory Visit: Payer: Self-pay

## 2021-04-14 ENCOUNTER — Encounter: Payer: Self-pay | Admitting: Podiatry

## 2021-04-14 DIAGNOSIS — E1121 Type 2 diabetes mellitus with diabetic nephropathy: Secondary | ICD-10-CM | POA: Insufficient documentation

## 2021-04-14 DIAGNOSIS — R32 Unspecified urinary incontinence: Secondary | ICD-10-CM | POA: Insufficient documentation

## 2021-04-14 DIAGNOSIS — E7800 Pure hypercholesterolemia, unspecified: Secondary | ICD-10-CM | POA: Insufficient documentation

## 2021-04-14 DIAGNOSIS — B351 Tinea unguium: Secondary | ICD-10-CM | POA: Diagnosis not present

## 2021-04-14 DIAGNOSIS — E1165 Type 2 diabetes mellitus with hyperglycemia: Secondary | ICD-10-CM | POA: Insufficient documentation

## 2021-04-14 DIAGNOSIS — Z794 Long term (current) use of insulin: Secondary | ICD-10-CM | POA: Insufficient documentation

## 2021-04-14 DIAGNOSIS — M79674 Pain in right toe(s): Secondary | ICD-10-CM

## 2021-04-14 DIAGNOSIS — Z862 Personal history of diseases of the blood and blood-forming organs and certain disorders involving the immune mechanism: Secondary | ICD-10-CM

## 2021-04-14 DIAGNOSIS — F329 Major depressive disorder, single episode, unspecified: Secondary | ICD-10-CM | POA: Insufficient documentation

## 2021-04-14 DIAGNOSIS — M79675 Pain in left toe(s): Secondary | ICD-10-CM

## 2021-04-14 DIAGNOSIS — F411 Generalized anxiety disorder: Secondary | ICD-10-CM | POA: Insufficient documentation

## 2021-04-14 DIAGNOSIS — N183 Chronic kidney disease, stage 3 unspecified: Secondary | ICD-10-CM | POA: Insufficient documentation

## 2021-04-14 DIAGNOSIS — G8929 Other chronic pain: Secondary | ICD-10-CM | POA: Insufficient documentation

## 2021-04-14 DIAGNOSIS — E039 Hypothyroidism, unspecified: Secondary | ICD-10-CM | POA: Insufficient documentation

## 2021-04-14 DIAGNOSIS — R630 Anorexia: Secondary | ICD-10-CM | POA: Insufficient documentation

## 2021-04-14 DIAGNOSIS — Z7951 Long term (current) use of inhaled steroids: Secondary | ICD-10-CM | POA: Insufficient documentation

## 2021-04-14 DIAGNOSIS — M545 Low back pain, unspecified: Secondary | ICD-10-CM | POA: Insufficient documentation

## 2021-04-14 DIAGNOSIS — K219 Gastro-esophageal reflux disease without esophagitis: Secondary | ICD-10-CM | POA: Insufficient documentation

## 2021-04-14 DIAGNOSIS — Z952 Presence of prosthetic heart valve: Secondary | ICD-10-CM | POA: Insufficient documentation

## 2021-04-14 DIAGNOSIS — J309 Allergic rhinitis, unspecified: Secondary | ICD-10-CM | POA: Insufficient documentation

## 2021-04-14 DIAGNOSIS — G629 Polyneuropathy, unspecified: Secondary | ICD-10-CM | POA: Insufficient documentation

## 2021-04-14 DIAGNOSIS — I739 Peripheral vascular disease, unspecified: Secondary | ICD-10-CM | POA: Insufficient documentation

## 2021-04-14 NOTE — Progress Notes (Signed)
?  Subjective:  ?Patient ID: [name] [name], male    DOB: 1946/01/15,   MRN: [id number] ? ?No chief complaint on file. ? ? ?76 y.o. male presents for concern of thickened elongated and painful toenails. Relates he is unable to trim himself. Is on blood thinners due to aortic dissection . Denies any other pedal complaints. Denies n/v/f/c.  ? ?PCP: Early Osmond MD  ? ?Past Medical History:  ?Diagnosis Date  ? 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 acute type A dissection with pericardial tamponade  ? Chronic back pain   ? Chronic chest pain   ? 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 acute type B aortic dissecton  ? DJD (degenerative joint disease), lumbosacral   ? 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 2 false aneurysms involving aortic root at proximal anastamosis  ? Hypertension   ? SOB (shortness of breath) on exertion   ? ? ?Objective:  ?Physical Exam: ?Vascular: DP/PT pulses 2/4 bilateral. CFT <3 seconds. Normal hair growth on digits. No edema.  ?Skin. No lacerations or abrasions bilateral feet. Nails 1-5 are thickened discolored and elongated with subungual debris. Third and fourth insterspaces macerated bilateral- improved.  ?Musculoskeletal: MMT 5/5 bilateral lower extremities in DF, PF, Inversion and Eversion. Deceased ROM in DF of ankle joint.  ?Neurological: Sensation intact to light touch.  ? ?Assessment:  ? ?No diagnosis found. ? ? ? ?Plan:  ?Patient was evaluated and treated and all questions answered. ?-Discussed and educated patient on foot care, especially with  ?regards to the vascular, neurological and musculoskeletal systems.  ?-Discussed supportive shoes at all times and checking feet regularly.  ?-Mechanically debrided all nails 1-5 bilateral using sterile nail nipper and filed with dremel without incident  ?-Answered all patient questions  ?-Patient to return  in 3 months for at risk foot care ?-Patient advised to call the office if any problems or questions arise in the  meantime. ? ? ?Lorenda Peck, DPM  ? ? ?

## 2021-04-15 ENCOUNTER — Ambulatory Visit: Payer: Medicare HMO | Admitting: Allergy & Immunology

## 2021-04-15 ENCOUNTER — Encounter: Payer: Self-pay | Admitting: Allergy & Immunology

## 2021-04-15 VITALS — BP 134/74 | HR 69 | Temp 97.7°F | Resp 18 | Ht 66.0 in | Wt 179.2 lb

## 2021-04-15 DIAGNOSIS — J302 Other seasonal allergic rhinitis: Secondary | ICD-10-CM

## 2021-04-15 DIAGNOSIS — R0602 Shortness of breath: Secondary | ICD-10-CM | POA: Diagnosis not present

## 2021-04-15 DIAGNOSIS — J3089 Other allergic rhinitis: Secondary | ICD-10-CM

## 2021-04-15 NOTE — Progress Notes (Signed)
? ?FOLLOW UP ? ?Date of Service/Encounter:  04/15/21 ? ? ?Assessment:  ? ?Rhinorrhea ?  ?Shortness of breath - COPD with history of smoking and exposure to chemicals during 9/11 (sees Dr. Carren Rang Pulmonologist in [place]) ?  ?Seasonal and perennial allergic rhinitis (ragweed, trees, molds, cat, and dust mite) ?  ?Complicated past medical history including thoracic aortic aneurysm as well as glaucoma ? ?Plan/Recommendations:  ? ?1. Rhinorrhea (ragweed, trees, molds, cat, and dust mite) ?- Symptoms seem well controlled with the current regimen. ?- Continue with Nasacort one spray per nostril daily (samples provided). ?- Continue with levocetirizine 5 mg daily. ?- I think we can hold off on allergy shots for now.  ? ?2. Shortness of breath ?- Lung testing not done today.  ?- Continue with Trelegy one puff once daily. ?- Continue with albuterol as needed.   ? ?3. Return in about 1 year (around 04/16/2022).  ? ? ?Subjective:  ? ?[name] [name] is a 76 y.o. male presenting today for follow up of  ?Chief Complaint  ?Patient presents with  ? Seasonal ans Perennial Allergic Rhinitis  ?  6 mth f/u - doing good  ? ? ?[name] [name] has a history of the following: ?Patient Active Problem List  ? Diagnosis Date Noted  ? Allergic rhinitis 04/14/2021  ? Chronic kidney disease, stage 3 unspecified ([place]) 04/14/2021  ? Chronic lumbar pain 04/14/2021  ? Diabetic renal disease ([place]) 04/14/2021  ? Gastroesophageal reflux disease 04/14/2021  ? Generalized anxiety disorder 04/14/2021  ? Hyperglycemia due to type 2 diabetes mellitus ([place]) 04/14/2021  ? Hypothyroidism 04/14/2021  ? Long term (current) use of inhaled steroids 04/14/2021  ? Long term (current) use of insulin ([place]) 04/14/2021  ? Major depression, single episode 04/14/2021  ? Neuropathy 04/14/2021  ? Poor appetite 04/14/2021  ? 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 Peripheral vascular disease ([place]) 04/14/2021  ? Pure hypercholesterolemia 04/14/2021  ? Urinary  incontinence 04/14/2021  ? Abdominal pain, LLQ (left lower quadrant) 03/14/2017  ? 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 S/P thoracic aortic aneurysm repair 01/19/2015  ? Glaucoma of both eyes 12/18/2013  ? Hypertrophic scar 12/02/2013  ? Mass of right side of neck 12/02/2013  ? Alzheimer's disease ([place]) 12/13/2012  ? Arthritis 12/13/2012  ? Headache 12/13/2012  ? Hyperlipidemia 12/13/2012  ? Memory impairment 12/13/2012  ? Multi-infarct dementia with depression 12/13/2012  ? Heart disease 12/13/2012  ? Type 2 diabetes mellitus without complication ([place]) 123456  ? Aortic dissection ([place]) 11/02/2012  ? 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 Chronic thoracic aortic dissection 11/10/2010  ? ? ?History obtained from: chart review and patient and wife . ? ?[name] [name] is a 76 y.o. male presenting for a follow up visit.  He was last seen in September 2022.  At that time, he had testing that was positive to ragweed, trees, molds, cat, and dust mite. We did send beta-2 transferrin but it seems that this was never collected.  ? ?Since last visit, he has done fairly well.  The current combination of medications have kept his symptoms at [place]. ? ?Asthma/Respiratory Symptom History: His breathing issues are under fair control.  He sees Dr. Carren Rang down in [place].  He seems to be managing his breathing issues effectively, and I have never done anything to address his breathing issues personally since he already had a pulmonologist. ? ?Allergic Rhinitis Symptom History: He remains  on levocetirizine and Nasaocort. No sinus infections at all.  He generally feels better now than he did when he first saw me. ? ?Otherwise, there have been no changes to his past medical history, surgical history, family history, or social history. ? ? ? ?Review of Systems  ?Constitutional: Negative.  Negative for fever, malaise/fatigue and weight loss.   ?HENT:  Positive for congestion. Negative for ear discharge and ear pain.   ?     Positive for rhinorrhea.  ?Eyes:  Negative for pain, discharge and redness.  ?Respiratory:  Negative for cough, sputum production, shortness of breath and wheezing.   ?Cardiovascular: Negative.  Negative for chest pain and palpitations.  ?Gastrointestinal:  Negative for abdominal pain, constipation, diarrhea, heartburn, nausea and vomiting.  ?Skin: Negative.  Negative for itching and rash.  ?Neurological:  Negative for dizziness and headaches.  ?Endo/Heme/Allergies:  Negative for environmental allergies. Does not bruise/bleed easily.   ? ? ? ?Objective:  ? ?Blood pressure 134/74, pulse 69, temperature 97.7 ?F (36.5 ?C), resp. rate 18, height 5\' 6"  (1.676 m), weight 179 lb 3.2 oz (81.3 kg), SpO2 94 %. ?Body mass index is 28.92 kg/m?. ? ? ? ?Physical Exam ?Vitals reviewed.  ?Constitutional:   ?   Appearance: He is well-developed. He is obese.  ?HENT:  ?   Head: Normocephalic and atraumatic.  ?   Right Ear: Tympanic membrane, ear canal and external ear normal. No drainage, swelling or tenderness. Tympanic membrane is not injected, scarred, erythematous, retracted or bulging.  ?   Left Ear: Tympanic membrane, ear canal and external ear normal. No drainage, swelling or tenderness. Tympanic membrane is not injected, scarred, erythematous, retracted or bulging.  ?   Nose: Rhinorrhea present. No nasal deformity, septal deviation or mucosal edema.  ?   Right Turbinates: Enlarged.  ?   Left Turbinates: Enlarged.  ?   Right Sinus: No maxillary sinus tenderness or frontal sinus tenderness.  ?   Left Sinus: No maxillary sinus tenderness or frontal sinus tenderness.  ?   Comments: Erythematous turbinates. ?   Mouth/Throat:  ?   Mouth: Mucous membranes are not pale and not dry.  ?   Pharynx: Uvula midline.  ?Eyes:  ?   General: Allergic shiner present.     ?   Right eye: No discharge.     ?   Left eye: No discharge.  ?   Conjunctiva/sclera:  Conjunctivae normal.  ?   Right eye: Right conjunctiva is not injected. No chemosis. ?   Left eye: Left conjunctiva is not injected. No chemosis. ?   Pupils: Pupils are equal, round, and reactive to light.  ?Cardiovascular:  ?   Rate and Rhythm: Normal rate and regular rhythm.  ?   Heart sounds: Normal heart sounds.  ?Pulmonary:  ?   Effort: Pulmonary effort is normal. No tachypnea, accessory muscle usage or respiratory distress.  ?   Breath sounds: Normal breath sounds. Decreased air movement and transmitted upper airway sounds present. No wheezing, rhonchi or rales.  ?Chest:  ?   Chest wall: No tenderness.  ?Abdominal:  ?   Tenderness: There is no abdominal tenderness. There is no guarding or rebound.  ?Lymphadenopathy:  ?   Head:  ?   Right side of head: No submandibular, tonsillar or occipital adenopathy.  ?   Left side of head: No submandibular, tonsillar or occipital adenopathy.  ?   Cervical: No cervical adenopathy.  ?Skin: ?   Coloration: Skin is not pale.  ?  Findings: No abrasion, erythema, petechiae or rash. Rash is not papular, urticarial or vesicular.  ?Neurological:  ?   Mental Status: He is alert.  ?  ? ?Diagnostic studies: none ? ? ? ? ? ?  ?Salvatore Marvel, MD  ?[facility] ? ? ? ? ? ? ?

## 2021-04-15 NOTE — Patient Instructions (Addendum)
1. Rhinorrhea (ragweed, trees, molds, cat, and dust mite) ?- Symptoms seem well controlled with the current regimen. ?- Continue with Nasacort one spray per nostril daily (samples provided). ?- Continue with levocetirizine 5 mg daily. ?- I think we can hold off on allergy shots for now.  ? ?2. Shortness of breath ?- Lung testing not done today.  ?- Continue with Trelegy one puff once daily. ?- Continue with albuterol as needed.   ? ?3. Return in about 1 year (around 04/16/2022).  ? ? ?Please inform us of any Emergency Department visits, hospitalizations, or changes in symptoms. Call us before going to the ED for breathing or allergy symptoms since we might be able to fit you in for a sick visit. Feel free to contact us anytime with any questions, problems, or concerns. ? ?It was a pleasure to see you again today! ? ?Websites that have reliable patient information: ?1. American Academy of Asthma, Allergy, and Immunology: www.aaaai.org ?2. Food Allergy Research and Education (FARE): foodallergy.org ?3. Mothers of Asthmatics: http://www.asthmacommunitynetwork.org ?4. [employer] of Allergy, Asthma, and Immunology: MissingWeapons.ca ? ? ?COVID-19 Vaccine Information can be found at: PodExchange.nl For questions related to vaccine distribution or appointments, please email [email or web address] or call [phone].  ? ?We realize that you might be concerned about having an allergic reaction to the COVID19 vaccines. To help with that concern, WE ARE OFFERING THE COVID19 VACCINES IN OUR OFFICE! Ask the front desk for dates!  ? ? ? ??Like? [place] on Facebook and Instagram for our latest updates!  ?  ? ? ?A healthy democracy works best when [employer] participate! Make sure you are registered to vote! If you have moved or changed any of your contact information, you will need to get this updated before voting! ? ?In some cases, you MAY be able to register to vote  online: [email or web address] ? ? ? ? ? ? ? ? ? ?

## 2021-04-18 ENCOUNTER — Encounter: Payer: Self-pay | Admitting: Allergy & Immunology

## 2021-04-18 MED ORDER — TRIAMCINOLONE ACETONIDE 55 MCG/ACT NA AERO: 1.0000 | INHALATION_SPRAY | Freq: Every day | NASAL | 1 refills | Status: AC

## 2021-04-18 MED ORDER — LEVOCETIRIZINE DIHYDROCHLORIDE 5 MG PO TABS: ORAL_TABLET | ORAL | 3 refills | Status: DC

## 2021-04-20 DIAGNOSIS — J301 Allergic rhinitis due to pollen: Secondary | ICD-10-CM | POA: Diagnosis not present

## 2021-04-20 DIAGNOSIS — G4733 Obstructive sleep apnea (adult) (pediatric): Secondary | ICD-10-CM | POA: Diagnosis not present

## 2021-04-20 DIAGNOSIS — J4540 Moderate persistent asthma, uncomplicated: Secondary | ICD-10-CM | POA: Diagnosis not present

## 2021-04-20 DIAGNOSIS — E559 Vitamin D deficiency, unspecified: Secondary | ICD-10-CM | POA: Diagnosis not present

## 2021-05-12 DIAGNOSIS — J4540 Moderate persistent asthma, uncomplicated: Secondary | ICD-10-CM | POA: Diagnosis not present

## 2021-05-12 DIAGNOSIS — J301 Allergic rhinitis due to pollen: Secondary | ICD-10-CM | POA: Diagnosis not present

## 2021-05-12 DIAGNOSIS — G4733 Obstructive sleep apnea (adult) (pediatric): Secondary | ICD-10-CM | POA: Diagnosis not present

## 2021-05-12 DIAGNOSIS — E559 Vitamin D deficiency, unspecified: Secondary | ICD-10-CM | POA: Diagnosis not present

## 2021-05-31 DIAGNOSIS — H401133 Primary open-angle glaucoma, bilateral, severe stage: Secondary | ICD-10-CM | POA: Diagnosis not present

## 2021-05-31 DIAGNOSIS — Z961 Presence of intraocular lens: Secondary | ICD-10-CM | POA: Diagnosis not present

## 2021-06-16 DIAGNOSIS — N179 Acute kidney failure, unspecified: Secondary | ICD-10-CM | POA: Diagnosis not present

## 2021-06-16 DIAGNOSIS — R1310 Dysphagia, unspecified: Secondary | ICD-10-CM | POA: Diagnosis not present

## 2021-06-16 DIAGNOSIS — F329 Major depressive disorder, single episode, unspecified: Secondary | ICD-10-CM | POA: Diagnosis not present

## 2021-06-16 DIAGNOSIS — F411 Generalized anxiety disorder: Secondary | ICD-10-CM | POA: Diagnosis not present

## 2021-06-16 DIAGNOSIS — I10 Essential (primary) hypertension: Secondary | ICD-10-CM | POA: Diagnosis not present

## 2021-06-16 DIAGNOSIS — G629 Polyneuropathy, unspecified: Secondary | ICD-10-CM | POA: Diagnosis not present

## 2021-06-16 DIAGNOSIS — I119 Hypertensive heart disease without heart failure: Secondary | ICD-10-CM | POA: Diagnosis not present

## 2021-06-16 DIAGNOSIS — R29810 Facial weakness: Secondary | ICD-10-CM | POA: Diagnosis not present

## 2021-06-16 DIAGNOSIS — R6889 Other general symptoms and signs: Secondary | ICD-10-CM | POA: Diagnosis not present

## 2021-06-16 DIAGNOSIS — E119 Type 2 diabetes mellitus without complications: Secondary | ICD-10-CM | POA: Diagnosis not present

## 2021-06-16 DIAGNOSIS — Z87891 Personal history of nicotine dependence: Secondary | ICD-10-CM | POA: Diagnosis not present

## 2021-06-16 DIAGNOSIS — Z0000 Encounter for general adult medical examination without abnormal findings: Secondary | ICD-10-CM | POA: Diagnosis not present

## 2021-06-16 DIAGNOSIS — Z794 Long term (current) use of insulin: Secondary | ICD-10-CM | POA: Diagnosis not present

## 2021-06-16 DIAGNOSIS — E039 Hypothyroidism, unspecified: Secondary | ICD-10-CM | POA: Diagnosis not present

## 2021-06-16 DIAGNOSIS — Z5181 Encounter for therapeutic drug level monitoring: Secondary | ICD-10-CM | POA: Diagnosis not present

## 2021-06-16 DIAGNOSIS — G510 Bell's palsy: Secondary | ICD-10-CM | POA: Diagnosis not present

## 2021-06-16 DIAGNOSIS — Z79899 Other long term (current) drug therapy: Secondary | ICD-10-CM | POA: Diagnosis not present

## 2021-06-16 DIAGNOSIS — E7800 Pure hypercholesterolemia, unspecified: Secondary | ICD-10-CM | POA: Diagnosis not present

## 2021-06-16 DIAGNOSIS — R2990 Unspecified symptoms and signs involving the nervous system: Secondary | ICD-10-CM | POA: Diagnosis not present

## 2021-06-21 DIAGNOSIS — E039 Hypothyroidism, unspecified: Secondary | ICD-10-CM | POA: Diagnosis not present

## 2021-06-21 DIAGNOSIS — E1165 Type 2 diabetes mellitus with hyperglycemia: Secondary | ICD-10-CM | POA: Diagnosis not present

## 2021-06-21 DIAGNOSIS — G510 Bell's palsy: Secondary | ICD-10-CM | POA: Diagnosis not present

## 2021-06-21 DIAGNOSIS — Z794 Long term (current) use of insulin: Secondary | ICD-10-CM | POA: Diagnosis not present

## 2021-07-21 ENCOUNTER — Encounter: Payer: Self-pay | Admitting: Podiatry

## 2021-07-21 ENCOUNTER — Ambulatory Visit: Payer: Medicare HMO | Admitting: Podiatry

## 2021-07-21 DIAGNOSIS — M79675 Pain in left toe(s): Secondary | ICD-10-CM | POA: Diagnosis not present

## 2021-07-21 DIAGNOSIS — Z862 Personal history of diseases of the blood and blood-forming organs and certain disorders involving the immune mechanism: Secondary | ICD-10-CM | POA: Diagnosis not present

## 2021-07-21 DIAGNOSIS — B351 Tinea unguium: Secondary | ICD-10-CM

## 2021-07-21 DIAGNOSIS — M79674 Pain in right toe(s): Secondary | ICD-10-CM

## 2021-07-21 NOTE — Progress Notes (Signed)
  Subjective:  Patient ID: [name] [name], male    DOB: 1946/01/19,   MRN: [id number]  Chief Complaint  Patient presents with   Diabetes    Diabetic foot care    76 y.o. male presents for concern of thickened elongated and painful toenails. Relates he is unable to trim himself. Is on blood thinners due to aortic dissection . Denies any other pedal complaints. Denies n/v/f/c.   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 CFT <3 seconds. Normal hair growth on digits. No edema.  Skin. No lacerations or abrasions bilateral feet. Nails 1-5 are thickened discolored and elongated with subungual debris. Third and fourth insterspaces macerated bilateral- improved.  Musculoskeletal: MMT 5/5 bilateral lower extremities in DF, PF, Inversion and Eversion. Deceased ROM in DF of ankle joint.  Neurological: Sensation intact to light touch.   Assessment:   1. Pain due to onychomycosis of toenails of both feet   2. Hx of blood coagulation disorder       Plan:  Patient was evaluated and treated and all questions answered. -Discussed and educated patient on foot care, especially with  regards to the vascular, neurological and musculoskeletal systems.  -Discussed supportive shoes at all times and checking feet regularly.  -Mechanically debrided all nails 1-5 bilateral using sterile nail nipper and filed with dremel without incident  -Answered all patient questions  -Patient  to return  in 3 months for at risk foot care -Patient advised to call the office if any problems or questions arise in the meantime.   Louann Sjogren, DPM

## 2021-08-17 ENCOUNTER — Ambulatory Visit: Payer: Medicare HMO | Attending: Internal Medicine

## 2021-09-02 ENCOUNTER — Other Ambulatory Visit: Payer: Self-pay

## 2021-09-02 ENCOUNTER — Other Ambulatory Visit: Payer: Self-pay | Admitting: Internal Medicine

## 2021-09-02 ENCOUNTER — Ambulatory Visit
Admission: RE | Admit: 2021-09-02 | Discharge: 2021-09-02 | Disposition: A | Discharge status: Home / Self Care | Payer: Medicare HMO | Source: Ambulatory Visit | Attending: Internal Medicine | Admitting: Internal Medicine

## 2021-09-02 ENCOUNTER — Inpatient Hospital Stay (HOSPITAL_COMMUNITY)
Admission: EM | Admit: 2021-09-02 | Discharge: 2021-09-04 | DRG: 066 | Disposition: A | Discharge status: Home / Self Care | Payer: Medicare HMO | Attending: Neurology | Admitting: Neurology

## 2021-09-02 DIAGNOSIS — E785 Hyperlipidemia, unspecified: Secondary | ICD-10-CM | POA: Diagnosis present

## 2021-09-02 DIAGNOSIS — Z7982 Long term (current) use of aspirin: Secondary | ICD-10-CM

## 2021-09-02 DIAGNOSIS — S0990XA Unspecified injury of head, initial encounter: Secondary | ICD-10-CM

## 2021-09-02 DIAGNOSIS — I10 Essential (primary) hypertension: Secondary | ICD-10-CM | POA: Diagnosis not present

## 2021-09-02 DIAGNOSIS — I7389 Other specified peripheral vascular diseases: Secondary | ICD-10-CM | POA: Diagnosis not present

## 2021-09-02 DIAGNOSIS — W19XXXA Unspecified fall, initial encounter: Secondary | ICD-10-CM | POA: Diagnosis present

## 2021-09-02 DIAGNOSIS — Z79890 Hormone replacement therapy (postmenopausal): Secondary | ICD-10-CM | POA: Diagnosis not present

## 2021-09-02 DIAGNOSIS — Z87891 Personal history of nicotine dependence: Secondary | ICD-10-CM | POA: Diagnosis not present

## 2021-09-02 DIAGNOSIS — Z8673 Personal history of transient ischemic attack (TIA), and cerebral infarction without residual deficits: Secondary | ICD-10-CM

## 2021-09-02 DIAGNOSIS — K219 Gastro-esophageal reflux disease without esophagitis: Secondary | ICD-10-CM | POA: Diagnosis present

## 2021-09-02 DIAGNOSIS — I610 Nontraumatic intracerebral hemorrhage in hemisphere, subcortical: Principal | ICD-10-CM | POA: Diagnosis present

## 2021-09-02 DIAGNOSIS — Z953 Presence of xenogenic heart valve: Secondary | ICD-10-CM

## 2021-09-02 DIAGNOSIS — R400 Somnolence: Secondary | ICD-10-CM | POA: Diagnosis not present

## 2021-09-02 DIAGNOSIS — E119 Type 2 diabetes mellitus without complications: Secondary | ICD-10-CM | POA: Diagnosis not present

## 2021-09-02 DIAGNOSIS — E1165 Type 2 diabetes mellitus with hyperglycemia: Secondary | ICD-10-CM | POA: Diagnosis not present

## 2021-09-02 DIAGNOSIS — H9190 Unspecified hearing loss, unspecified ear: Secondary | ICD-10-CM | POA: Diagnosis not present

## 2021-09-02 DIAGNOSIS — F0390 Unspecified dementia without behavioral disturbance: Secondary | ICD-10-CM | POA: Diagnosis not present

## 2021-09-02 DIAGNOSIS — I619 Nontraumatic intracerebral hemorrhage, unspecified: Principal | ICD-10-CM | POA: Diagnosis present

## 2021-09-02 DIAGNOSIS — I6523 Occlusion and stenosis of bilateral carotid arteries: Secondary | ICD-10-CM | POA: Diagnosis not present

## 2021-09-02 DIAGNOSIS — Y929 Unspecified place or not applicable: Secondary | ICD-10-CM | POA: Diagnosis not present

## 2021-09-02 DIAGNOSIS — Z79899 Other long term (current) drug therapy: Secondary | ICD-10-CM

## 2021-09-02 DIAGNOSIS — G8929 Other chronic pain: Secondary | ICD-10-CM | POA: Diagnosis present

## 2021-09-02 DIAGNOSIS — Z7984 Long term (current) use of oral hypoglycemic drugs: Secondary | ICD-10-CM

## 2021-09-02 DIAGNOSIS — Z794 Long term (current) use of insulin: Secondary | ICD-10-CM

## 2021-09-02 DIAGNOSIS — Z8679 Personal history of other diseases of the circulatory system: Secondary | ICD-10-CM | POA: Diagnosis not present

## 2021-09-02 DIAGNOSIS — R29818 Other symptoms and signs involving the nervous system: Secondary | ICD-10-CM | POA: Diagnosis not present

## 2021-09-02 DIAGNOSIS — H409 Unspecified glaucoma: Secondary | ICD-10-CM | POA: Diagnosis present

## 2021-09-02 DIAGNOSIS — R5383 Other fatigue: Secondary | ICD-10-CM | POA: Diagnosis not present

## 2021-09-02 DIAGNOSIS — E559 Vitamin D deficiency, unspecified: Secondary | ICD-10-CM | POA: Diagnosis not present

## 2021-09-02 DIAGNOSIS — N1831 Chronic kidney disease, stage 3a: Secondary | ICD-10-CM | POA: Diagnosis not present

## 2021-09-02 DIAGNOSIS — G319 Degenerative disease of nervous system, unspecified: Secondary | ICD-10-CM | POA: Diagnosis not present

## 2021-09-02 DIAGNOSIS — I680 Cerebral amyloid angiopathy: Secondary | ICD-10-CM | POA: Diagnosis not present

## 2021-09-02 DIAGNOSIS — Z125 Encounter for screening for malignant neoplasm of prostate: Secondary | ICD-10-CM | POA: Diagnosis not present

## 2021-09-02 DIAGNOSIS — I6389 Other cerebral infarction: Secondary | ICD-10-CM | POA: Diagnosis not present

## 2021-09-02 LAB — CBC WITH DIFFERENTIAL/PLATELET
Abs Immature Granulocytes: 0.03 10*3/uL (ref 0.00–0.07)
Basophils Absolute: 0 10*3/uL (ref 0.0–0.1)
Basophils Relative: 0 %
Eosinophils Absolute: 0.4 10*3/uL (ref 0.0–0.5)
Eosinophils Relative: 6 %
HCT: 40.7 % (ref 39.0–52.0)
Hemoglobin: 12.5 g/dL — ABNORMAL LOW (ref 13.0–17.0)
Immature Granulocytes: 0 %
Lymphocytes Relative: 18 %
Lymphs Abs: 1.3 10*3/uL (ref 0.7–4.0)
MCH: 34.6 pg — ABNORMAL HIGH (ref 26.0–34.0)
MCHC: 30.7 g/dL (ref 30.0–36.0)
MCV: 112.7 fL — ABNORMAL HIGH (ref 80.0–100.0)
Monocytes Absolute: 0.8 10*3/uL (ref 0.1–1.0)
Monocytes Relative: 11 %
Neutro Abs: 4.8 10*3/uL (ref 1.7–7.7)
Neutrophils Relative %: 65 %
Platelets: 177 10*3/uL (ref 150–400)
RBC: 3.61 MIL/uL — ABNORMAL LOW (ref 4.22–5.81)
RDW: 14.6 % (ref 11.5–15.5)
WBC: 7.3 10*3/uL (ref 4.0–10.5)
nRBC: 0 % (ref 0.0–0.2)

## 2021-09-02 LAB — COMPREHENSIVE METABOLIC PANEL
ALT: 14 U/L (ref 0–44)
AST: 24 U/L (ref 15–41)
Albumin: 4 g/dL (ref 3.5–5.0)
Alkaline Phosphatase: 52 U/L (ref 38–126)
Anion gap: 7 (ref 5–15)
BUN: 22 mg/dL (ref 8–23)
CO2: 29 mmol/L (ref 22–32)
Calcium: 9.7 mg/dL (ref 8.9–10.3)
Chloride: 107 mmol/L (ref 98–111)
Creatinine, Ser: 1.52 mg/dL — ABNORMAL HIGH (ref 0.61–1.24)
GFR, Estimated: 47 mL/min — ABNORMAL LOW (ref >60)
Glucose, Bld: 98 mg/dL (ref 70–99)
Potassium: 4.4 mmol/L (ref 3.5–5.1)
Sodium: 143 mmol/L (ref 135–145)
Total Bilirubin: 0.6 mg/dL (ref 0.3–1.2)
Total Protein: 7.2 g/dL (ref 6.5–8.1)

## 2021-09-02 LAB — HEMOGLOBIN A1C
Hgb A1c MFr Bld: 4.5 % — ABNORMAL LOW (ref 4.8–5.6)
Mean Plasma Glucose: 82.45 mg/dL

## 2021-09-02 LAB — GLUCOSE, CAPILLARY: Glucose-Capillary: 124 mg/dL — ABNORMAL HIGH (ref 70–99)

## 2021-09-02 LAB — APTT: aPTT: 32 seconds (ref 24–36)

## 2021-09-02 LAB — PROTIME-INR
INR: 1 (ref 0.8–1.2)
Prothrombin Time: 13.3 seconds (ref 11.4–15.2)

## 2021-09-02 LAB — MRSA NEXT GEN BY PCR, NASAL: MRSA by PCR Next Gen: NOT DETECTED

## 2021-09-02 MED ORDER — ROSUVASTATIN CALCIUM 20 MG PO TABS
20.0000 mg | ORAL_TABLET | Freq: Every day | ORAL | Status: DC
  Administered 2021-09-03: 20 mg via ORAL
  Filled 2021-09-02: qty 1

## 2021-09-02 MED ORDER — PANTOPRAZOLE SODIUM 40 MG IV SOLR
40.0000 mg | Freq: Every day | INTRAVENOUS | Status: DC
  Administered 2021-09-03: 40 mg via INTRAVENOUS
  Filled 2021-09-02 (×2): qty 10

## 2021-09-02 MED ORDER — ALBUTEROL SULFATE (2.5 MG/3ML) 0.083% IN NEBU: 2.5000 mg | INHALATION_SOLUTION | Freq: Four times a day (QID) | RESPIRATORY_TRACT | Status: DC | PRN

## 2021-09-02 MED ORDER — HYDRALAZINE HCL 20 MG/ML IJ SOLN
10.0000 mg | INTRAMUSCULAR | Status: DC | PRN
  Administered 2021-09-02 – 2021-09-04 (×5): 10 mg via INTRAVENOUS
  Filled 2021-09-02 (×5): qty 1

## 2021-09-02 MED ORDER — SENNOSIDES-DOCUSATE SODIUM 8.6-50 MG PO TABS
1.0000 | ORAL_TABLET | Freq: Two times a day (BID) | ORAL | Status: DC
  Administered 2021-09-03 (×2): 1 via ORAL
  Filled 2021-09-02 (×2): qty 1

## 2021-09-02 MED ORDER — INSULIN ASPART 100 UNIT/ML IJ SOLN
0.0000 [IU] | INTRAMUSCULAR | Status: DC
  Administered 2021-09-02 – 2021-09-03 (×2): 2 [IU] via SUBCUTANEOUS

## 2021-09-02 MED ORDER — ACETAMINOPHEN 325 MG PO TABS
650.0000 mg | ORAL_TABLET | ORAL | Status: DC | PRN
  Administered 2021-09-02: 650 mg via ORAL
  Filled 2021-09-02: qty 2

## 2021-09-02 MED ORDER — BRIMONIDINE TARTRATE 0.2 % OP SOLN
1.0000 [drp] | Freq: Three times a day (TID) | OPHTHALMIC | Status: DC
  Administered 2021-09-02 – 2021-09-03 (×4): 1 [drp] via OPHTHALMIC
  Filled 2021-09-02 (×2): qty 5

## 2021-09-02 MED ORDER — LATANOPROST 0.005 % OP SOLN
1.0000 [drp] | Freq: Every day | OPHTHALMIC | Status: DC
  Administered 2021-09-03 (×2): 1 [drp] via OPHTHALMIC
  Filled 2021-09-02 (×2): qty 2.5

## 2021-09-02 MED ORDER — STROKE: EARLY STAGES OF RECOVERY BOOK
Freq: Once | Status: AC
Start: 2021-09-03 — End: 2021-09-03
  Filled 2021-09-02: qty 1

## 2021-09-02 MED ORDER — LEVOTHYROXINE SODIUM 25 MCG PO TABS
25.0000 ug | ORAL_TABLET | Freq: Every day | ORAL | Status: DC
  Administered 2021-09-04: 25 ug via ORAL
  Filled 2021-09-02: qty 1

## 2021-09-02 MED ORDER — SODIUM CHLORIDE 0.9 % IV SOLN: INTRAVENOUS | Status: DC

## 2021-09-02 MED ORDER — GABAPENTIN 100 MG PO CAPS
100.0000 mg | ORAL_CAPSULE | Freq: Three times a day (TID) | ORAL | Status: DC
Start: 2021-09-02 — End: 2021-09-04
  Administered 2021-09-03 – 2021-09-04 (×5): 100 mg via ORAL
  Filled 2021-09-02 (×4): qty 1

## 2021-09-02 MED ORDER — ACETAMINOPHEN 650 MG RE SUPP: 650.0000 mg | RECTAL | Status: DC | PRN

## 2021-09-02 MED ORDER — ACETAMINOPHEN 160 MG/5ML PO SOLN: 650.0000 mg | ORAL | Status: DC | PRN

## 2021-09-02 MED ORDER — ORAL CARE MOUTH RINSE
15.0000 mL | OROMUCOSAL | Status: DC | PRN
  Administered 2021-09-03: 15 mL via OROMUCOSAL

## 2021-09-02 NOTE — Progress Notes (Addendum)
Dr. Derry Lory made aware of SBP of 126/90, which is below goal of SBP 130-150. Dr. Derry Lory said to keep SBP above 90. No new orders.

## 2021-09-02 NOTE — ED Triage Notes (Addendum)
Pt presents after being sent by physician for CT head positive for parenchymal hemorrhage.  Pt had fall on 7/15 and hit right side of head on the wall.  Pt's wife reports he was seen by their physician and "checked out from head to toe"  Wife reports son had requested CT scan to evaluate pt as he has had increasing headaches.  CT scan done today and pt received call to present immediately to ED for eval of head bleed.

## 2021-09-02 NOTE — ED Provider Triage Note (Signed)
Emergency Medicine Provider Triage Evaluation Note  [name] [name] , a 76 y.o. male  was evaluated in triage.  Pt sent in from PCP after he had outpatient head CT which was positive for an intraparenchymal hemorrhage.  Per wife patient had a fall on 7/15.  About a week later she noticed that he seemed to be acting a bit off and having some difficulty walking.  Complaining of increasing headaches.  Review of Systems  Positive: Headaches, gait instability Negative: Weakness, numbness, vision changes, seizures  Physical Exam  BP (!) 161/82 (BP Location: Right Arm)   Pulse 69   Temp 99.2 F (37.3 C) (Oral)   Resp 14   SpO2 95%  Gen:   Awake, no distress   Resp:  Normal effort  MSK:   Moves extremities without difficulty  Other:  No cranial nerve deficits, bilateral upper and lower extremities with normal strength.  Medical Decision Making  Medically screening exam initiated at 6:34 PM.  Appropriate orders placed.  Rodgerick Gilliand was informed that the remainder of the evaluation will be completed by another provider, this initial triage assessment does not replace that evaluation, and the importance of remaining in the ED until their evaluation is complete.  Patient with intraparenchymal hemorrhage on outpatient head CT, will need immediate bed for blood pressure management and further treatment.  Contacted neurologist and patient will immediately go back to bed 10.   Dartha Lodge, [place] 09/02/21 [phone]

## 2021-09-02 NOTE — ED Notes (Signed)
Patient dressed into hospital gown. HOB placed above 30 degrees. Patient connected to BP, SPO2 and heart bedside monitor. Pt resting comfortably and no other complaints at this time

## 2021-09-02 NOTE — ED Provider Notes (Signed)
[facility] NEURO/TRAUMA/SURGICAL ICU Provider Note   CSN: [id number] Arrival date & time: 09/02/21  1749     History  Chief Complaint  Patient presents with   Hemorrhage    [name] [name] is a 76 y.o. male.  Presenting for abnormal CT scan.  Had a fall a couple weeks ago.  Has been off balance.  Saw primary care doctor who did outpatient CT scan.  Found to have 10 mm acute parenchymal hemorrhage at the junction of the right lentiform nucleus and retrolenticular white matter.  Patient currently does not have any complaints.  He denies any new numbness weakness vision change or speech change.  HPI     Home Medications Prior to Admission medications   Medication Sig Start Date End Date Taking? Authorizing Provider  azelastine (ASTELIN) 0.1 % nasal spray 1 puff in each nostril   Yes [provider]  desvenlafaxine (PRISTIQ) 100 MG 24 hr tablet 1 tablet 07/24/20  Yes [provider]  gabapentin (NEURONTIN) 100 MG capsule 1 capsule 11/18/20  Yes [provider]  ipratropium (ATROVENT) 0.06 % nasal spray 2 sprays in each nostril 10/27/20  Yes [provider]  latanoprost (XALATAN) 0.005 % ophthalmic solution 1 drop into affected eye in the evening 03/20/17  Yes [provider]  levothyroxine (SYNTHROID) 25 MCG tablet Take 25 mcg by mouth daily. 04/01/21  Yes [provider]  metFORMIN (GLUCOPHAGE) 1000 MG tablet Take 1,000 mg by mouth 2 (two) times daily. 02/20/21  Yes [provider]  metoprolol succinate (TOPROL-XL) 100 MG 24 hr tablet Take 100 mg by mouth daily. 03/24/21  Yes [provider]  rosuvastatin (CRESTOR) 20 MG tablet Take 20 mg by mouth at bedtime. 02/18/21  Yes [provider]  triamcinolone (NASACORT) 55 MCG/ACT AERO nasal inhaler Place 1 spray into the nose daily. 04/18/21  Yes Valentina Shaggy, MD  acetaminophen (TYLENOL) 500 MG tablet 1 tablet as needed    [provider]  albuterol  (PROVENTIL) (2.5 MG/3ML) 0.083% nebulizer solution 3 mL as needed    [provider]  Alcohol Swabs PADS See admin instructions. 12/04/20   [provider]  amLODipine (NORVASC) 10 MG tablet Take 10 mg by mouth daily. In the am     [provider]  amLODipine (NORVASC) 5 MG tablet Take 5 mg by mouth daily. At bedtime      [provider]  aspirin 81 MG EC tablet 81 mg.    [provider]  brimonidine (ALPHAGAN) 0.2 % ophthalmic solution 1 drop into affected eye    [provider]  clonazePAM (KLONOPIN) 0.5 MG tablet 1/2-1 tablet    [provider]  docusate sodium (COLACE) 100 MG capsule Take 200 mg by mouth 1 day or 1 dose.      [provider]  escitalopram (LEXAPRO) 10 MG tablet 10 mg. 11/07/12   [provider]  glimepiride (AMARYL) 2 MG tablet  12/25/20   [provider]  insulin glargine (LANTUS SOLOSTAR) 100 UNIT/ML Solostar Pen inject Patient not taking: Reported on 09/02/2021 12/04/20   [provider]  labetalol (NORMODYNE) 200 MG tablet Take 200 mg by mouth 2 (two) times daily. Takes 4 tabs bid     [provider]  lansoprazole (PREVACID) 30 MG capsule 30 mg.    [provider]  levocetirizine (XYZAL) 5 MG tablet Take 1 tablet (5 mg total) by mouth 2 (two) times daily as needed for allergies. 10/15/20  Alfonse Spruce, MD  levocetirizine Elita Boone) 5 MG tablet 1 tablet in the evening 04/18/21   Alfonse Spruce, MD  lidocaine (LIDODERM) 5 % 1 patch daily. 04/02/21   [provider]  losartan (COZAAR) 50 MG tablet Take by mouth.    [provider]  meclizine (ANTIVERT) 25 MG tablet 1 tablet    [provider]  omeprazole (PRILOSEC) 20 MG capsule 1 capsule 30 minutes before morning meal    [provider]  oxycodone (OXY-IR) 5 MG capsule Take 5 mg by mouth every 4 (four) hours as needed. 1 or 2 tabs q4-6hrs prn     [provider]  pantoprazole (PROTONIX) 40 MG tablet Take 40 mg by mouth 2 (two) times daily.      [provider]  pravastatin (PRAVACHOL) 40 MG tablet 40 mg.    [provider]  tiZANidine (ZANAFLEX) 4 MG tablet 4 mg.    [provider]  traMADol (ULTRAM) 50 MG tablet 1 tablet    [provider]  Travoprost, BAK Free, (TRAVATAN) 0.004 % SOLN ophthalmic solution 0.004 %.    [provider]  triamcinolone cream (KENALOG) 0.1 % 0.1 %.    [provider]  umeclidinium-vilanterol Ailene Ards ELLIPTA) 62.5-25 MCG/ACT AEPB  06/22/17   [provider]      Allergies    Patient has no known allergies.    Review of Systems   Review of Systems  Constitutional:  Negative for chills and fever.  HENT:  Negative for ear pain and sore throat.   Eyes:  Negative for pain and visual disturbance.  Respiratory:  Negative for cough and shortness of breath.   Cardiovascular:  Negative for chest pain and palpitations.  Gastrointestinal:  Negative for abdominal pain and vomiting.  Genitourinary:  Negative for dysuria and hematuria.  Musculoskeletal:  Negative for arthralgias and back pain.  Skin:  Negative for color change and rash.  Neurological:  Negative for seizures and syncope.  All other systems reviewed and are negative.   Physical Exam Updated Vital Signs BP (!) 126/90   Pulse 70   Temp 99.2 F (37.3 C) (Oral)   Resp 19   SpO2 94%  Physical Exam Vitals and nursing note reviewed.  Constitutional:      General: He is not in acute distress.    Appearance: He is well-developed.  HENT:     Head: Normocephalic and atraumatic.  Eyes:     Conjunctiva/sclera: Conjunctivae normal.  Cardiovascular:     Rate and Rhythm: Normal rate and regular rhythm.     Heart sounds: No murmur heard. Pulmonary:     Effort: Pulmonary effort is normal. No respiratory distress.     Breath sounds: Normal breath sounds.  Abdominal:     Palpations: Abdomen is soft.      Tenderness: There is no abdominal tenderness.  Musculoskeletal:        General: No swelling.     Cervical back: Neck supple.  Skin:    General: Skin is warm and dry.     Capillary Refill: Capillary refill takes less than 2 seconds.  Neurological:     Mental Status: He is alert.     Comments: AAOx3 CN 2-12 intact, speech clear visual fields intact 5/5 strength in b/l UE and LE Sensation to light touch intact in b/l UE and LE Normal FNF  Psychiatric:        Mood and Affect: Mood normal.  ED Results / Procedures / Treatments   Labs (all labs ordered are listed, but only abnormal results are displayed) Labs Reviewed  CBC WITH DIFFERENTIAL/PLATELET - Abnormal; Notable for the following components:      Result Value   RBC 3.61 (*)    Hemoglobin 12.5 (*)    MCV 112.7 (*)    MCH 34.6 (*)    All other components within normal limits  COMPREHENSIVE METABOLIC PANEL - Abnormal; Notable for the following components:   Creatinine, Ser 1.52 (*)    GFR, Estimated 47 (*)    All other components within normal limits  MRSA NEXT GEN BY PCR, NASAL  PROTIME-INR  APTT  HEMOGLOBIN A1C    EKG EKG Interpretation  Date/Time:  Thursday September 02 2021 18:36:01 EDT Ventricular Rate:  75 PR Interval:  164 QRS Duration: 80 QT Interval:  396 QTC Calculation: 442 R Axis:   59 Text Interpretation: Normal sinus rhythm with sinus arrhythmia Normal ECG When compared with ECG of 26-Nov-2020 19:50, No acute changes Confirmed by Marianna Fuss ([id number]) on 09/02/2021 7:37:54 PM  Radiology CT HEAD WO CONTRAST ( )  Result Date: 09/02/2021 CLINICAL DATA:  Injury of head, initial encounter. Additional history provided: Fall in July, history of CAD, hypertension, diabetes mellitus. EXAM: CT HEAD WITHOUT CONTRAST TECHNIQUE: Contiguous axial images were obtained from the base of the skull through the vertex without intravenous contrast. RADIATION DOSE REDUCTION: This exam was performed according to the  departmental dose-optimization program which includes automated exposure control, adjustment of the mA and/or kV according to patient size and/or use of iterative reconstruction technique. COMPARISON:  CT 06/22/2007. FINDINGS: Brain: Moderate cerebral atrophy.  Comparatively mild cerebellar atrophy. 10 mm acute parenchymal hemorrhage at the junction of the right lentiform nucleus and retrolenticular white matter (series 2, image 16) (series 6, image 43). Moderate patchy and ill-defined hypoattenuation within the cerebral white matter, nonspecific but compatible chronic small vessel disease. 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 No demarcated cortical infarct. No extra-axial fluid collection. No evidence of an intracranial mass. No midline shift. Vascular: No hyperdense vessel. Skull: No fracture or aggressive osseous lesion. Sinuses/Orbits: No mass or acute finding within the imaged orbits. Mild mucosal thickening within the left frontal and bilateral ethmoid sinuses. Impression #1 called by telephone at the time of interpretation on 09/02/2021 at 12:11 pm to provider [facility] , who verbally acknowledged these results. IMPRESSION: 10 mm acute parenchymal hemorrhage at the junction of the right lentiform nucleus and retrolenticular white matter. Moderate chronic small vessel ischemic changes within the cerebral white matter. Chronic infarcts within bilateral cerebellar hemispheres, some of which are new from the prior head CT of 06/22/2007. Moderate cerebral atrophy. Comparatively mild cerebellar atrophy. Electronically Signed   By: Jackey Loge D.O.   On: 09/02/2021 12:14    Procedures .Critical Care  Performed by: Milagros Loll, MD Authorized by: Milagros Loll, MD   Critical care provider statement:    Critical care time (minutes):  38   Critical care was time spent personally by me on the following activities:   Development of treatment plan with patient or surrogate, discussions with consultants, evaluation of patient's response to treatment, examination of patient, ordering and review of laboratory studies, ordering and review of radiographic studies, ordering and performing treatments and interventions, pulse oximetry, re-evaluation of patient's condition and review of old charts     Medications Ordered in ED Medications  stroke: early stages of recovery book (has no administration in time range)  acetaminophen (TYLENOL) tablet 650 mg (650 mg Oral Given 09/02/21 2059)    Or  acetaminophen (TYLENOL) 160 MG/5ML solution 650 mg ( Per Tube See Alternative 09/02/21 2059)    Or  acetaminophen (TYLENOL) suppository 650 mg ( Rectal See Alternative 09/02/21 2059)  senna-docusate (Senokot-S) tablet 1 tablet (1 tablet Oral Not Given 09/02/21 2219)  pantoprazole (PROTONIX) injection 40 mg (40 mg Intravenous Patient Refused/Not Given 09/02/21 2229)  hydrALAZINE (APRESOLINE) injection 10 mg (10 mg Intravenous Given 09/02/21 1952)  0.9 %  sodium chloride infusion ( Intravenous New Bag/Given 09/02/21 2228)  insulin aspart (novoLOG) injection 0-15 Units (has no administration in time range)  albuterol (PROVENTIL) (2.5 MG/3ML) 0.083% nebulizer solution 2.5 mg (has no administration in time range)  brimonidine (ALPHAGAN) 0.2 % ophthalmic solution 1 drop (has no administration in time range)  gabapentin (NEURONTIN) capsule 100 mg (100 mg Oral Not Given 09/02/21 2218)  latanoprost (XALATAN) 0.005 % ophthalmic solution 1 drop (has no administration in time range)  levothyroxine (SYNTHROID) tablet 25 mcg (has no administration in time range)  rosuvastatin (CRESTOR) tablet 20 mg (20 mg Oral Not Given 09/02/21 2218)  Oral care mouth rinse (has no administration in time range)    ED Course/ Medical Decision Making/ A&P                           Medical Decision Making Risk Decision regarding hospitalization.   76 year old male  presenting to ER due to concern for abnormal CT scan on an outpatient basis.  Had a fall a couple weeks ago and has been reportedly unsteady, some difficulty walking, increasing headaches.  CT scan obtained prior to ER visit showed intraparenchymal hemorrhage.  Neurology consulted, they will admit to their service, ICU monitoring, blood pressure control.  I discussed case with our neurology team, Dr. Quinn Axe and Dr. Milas Gain.  They came to the bedside and admitted the patient.  Currently patient is well-appearing and does not have focal neurologic complaint or deficit per my exam.  BP control per neurology.        Final Clinical Impression(s) / ED Diagnoses Final diagnoses:  Nontraumatic intracerebral hemorrhage, unspecified cerebral location, unspecified laterality [facility])    Rx / DC Orders ED Discharge Orders     None         Lucrezia Starch, MD 09/02/21 2253

## 2021-09-02 NOTE — H&P (Signed)
NEUROLOGY CONSULTATION NOTE   Date of service: September 02, 2021 Patient Name: [name] [name] MRN:  [id number] DOB:  [birth year]  _ _ _   _ __   _ __ _ _  __ __   _ __   __ _  History of Present Illness  [name] [name] is a 76 y.o. male with PMH significant for history of ascending aortic dissection, chronic back pain and L5 fracture, degenerative disc disease, hypertension, hard of hearing and underlying dementia who had a fall on 08/14/2021.  He was seen by the PCP with no focal deficit on exam.  He had a screening outpatient CT head without contrast which demonstrated a small 10 mm acute intraparenchymal hemorrhage at the junction of the right lentiform nucleus and the retrolenticular white matter.  He takes a baby aspirin at home and took it today.  He is not on any anticoagulation.  No prior history of stroke or ICH.  He does have hypertension the family monitors his blood pressures about twice a day and notes that it ranges from 132 systolics to 440N.  Does not smoke, no alcohol intake.   LKW: unclear, no symptoms. mRS: 2 ICH score: 0 tNKASE: not offered 2/2 ICH Thrombectomy: not offered due to [place] NIHSS components Score: Comment  1a Level of Conscious 0_0  1_1  2_2  3_3      1b LOC Questions 0_4  1_5  2_6       1c LOC Commands 0_7  1_8  2_9       2 Best Gaze 0_10  1_11  2_12       3 Visual 0_13  1_14  2_15  3_16      4 Facial Palsy 0_17  1_18  2_19  3_20      5a Motor Arm - left 0_21  1_22  2_23  3_24  4_25  UN_26    5b Motor Arm - Right 0_27  1_28  2_29  3_30  4_31  UN_32    6a Motor Leg - Left 0_33  1_34  2_35  3_36  4_37  UN_38    6b Motor Leg - Right 0_39  1_40  2_41  3_42  4_43  UN_44    7 Limb Ataxia 0_45  1_46  2_47  3_48  UN_49     8 Sensory 0_50  1_51  2_52  UN_53      9 Best Language 0_54  1_55  2_56  3_57      10 Dysarthria 0_58  1_59  2_60  UN_61      11 Extinct. and Inattention 0_62  1_63  2_64       TOTAL: 1     ROS   Constitutional Denies weight loss, fever and chills.   HEENT Denies changes in vision and hearing.   Respiratory Denies SOB and cough.   CV  Denies palpitations and CP   GI Denies abdominal pain, nausea, vomiting and diarrhea.   GU Denies dysuria and urinary frequency.   MSK Denies myalgia and joint pain.   Skin Denies rash and pruritus.   Neurological Denies headache and syncope.   Psychiatric Denies recent changes in mood. Denies anxiety and depression.    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 Social History   Socioeconomic History   Marital status: Married    Spouse name: Not on file   Number of children: Not on file   Years of education: Not on file   Highest education level: Not on file  Occupational History   Not on file  Tobacco Use   Smoking status: Former    Types: Cigarettes    Quit date: 03/24/2009    Years since quitting: 12.4   Smokeless tobacco: Never  Substance and Sexual Activity   Alcohol use: No   Drug use: Not on file   Sexual activity: Not on file  Other Topics Concern   Not on file  Social History Narrative   ** Merged History Encounter **       Social Determinants of Health   Financial Resource Strain: Not on file  Food Insecurity: Not on file  Transportation Needs: Not on file  Physical Activity: Not on file  Stress: Not on file  Social Connections: Not on file   No Known  Allergies  Medications  (Not in a hospital admission)    Vitals   Vitals:   09/02/21 1832  BP: (!) 161/82  Pulse: 69  Resp: 14  Temp: 99.2 F (37.3 C)  TempSrc: Oral  SpO2: 95%     There is no height or weight on file to calculate BMI.  Physical Exam   General: Laying comfortably in bed; in no acute distress.  HENT: Normal oropharynx and mucosa. Normal external appearance of ears and nose.  Neck: Supple, no pain or tenderness  CV: No JVD. No peripheral edema.  Pulmonary: Symmetric Chest rise. Normal respiratory effort.  Abdomen: Soft to touch, non-tender.  Ext: No cyanosis, edema, or deformity  Skin: No rash. Normal palpation of skin.   Musculoskeletal: Normal digits and nails by inspection. No clubbing.   Neurologic Examination  Mental status/Cognition: Alert, oriented to self, place, not to month but oriented to year, poor attention.  Speech/language: Fluent, comprehension intact, object naming intact, repetition intact.  Cranial nerves:   CN II Pupils equal and reactive to light, no VF deficits    CN III,IV,VI EOM intact, no gaze preference or deviation, no nystagmus    CN V normal sensation in V1, V2, and V3 segments bilaterally    CN VII no asymmetry, no nasolabial fold flattening    CN VIII normal hearing to speech    CN IX & X normal palatal elevation, no uvular deviation    CN XI 5/5 head turn and 5/5 shoulder shrug bilaterally    CN XII midline tongue protrusion    Motor:  Muscle bulk: normal, tone normal, pronator drift none tremor none Mvmt Root Nerve  Muscle Right Left Comments  SA C5/6 Ax Deltoid 5 5   EF C5/6 Mc Biceps 5 5   EE C6/7/8 Rad Triceps 5 5   WF C6/7 Med FCR     WE C7/8 PIN ECU     F Ab C8/T1 U ADM/FDI 5 5   HF L1/2/3 Fem Illopsoas 5 5   KE L2/3/4 Fem Quad 5 5   DF L4/5 D Peron Tib Ant 5 5   PF S1/2 Tibial Grc/Sol 5 5    Reflexes:  Right Left Comments  Pectoralis      Biceps (C5/6) 2 2   Brachioradialis (C5/6) 2 2  Triceps  (C6/7) 2 2    Patellar (L3/4) 2 2    Achilles (S1)      Hoffman      Plantar     Jaw jerk    Sensation:  Light touch Intact throughout   Pin prick    Temperature    Vibration   Proprioception    Coordination/Complex Motor:  - Finger to Nose intact bilateral - Heel to shin the intact bilaterally - Rapid alternating movement are normal - Gait: Deferred for patient's safety. Labs   CBC: No results for input(s): "WBC", "NEUTROABS", "HGB", "HCT", "MCV", "PLT" in the last 168 hours.  Basic Metabolic Panel:  Lab Results  Component Value Date   NA 137 11/26/2020   K 5.1 11/26/2020   CO2 27 11/26/2020   GLUCOSE 377 (H) 11/26/2020   BUN 28 (H) 11/26/2020   CREATININE 1.88 (H) 11/26/2020   CALCIUM 9.5 11/26/2020   GFRNONAA 37 (L) 11/26/2020   GFRAA  03/31/2009    >60        The eGFR has been calculated using the MDRD equation. This calculation has not been validated in all clinical situations. eGFR's persistently <60 mL/min signify possible Chronic Kidney Disease.   Lipid Panel: No results found for: "LDLCALC" HgbA1c: No results found for: "HGBA1C" Urine Drug Screen: No results found for: "LABOPIA", "COCAINSCRNUR", "LABBENZ", "AMPHETMU", "THCU", "LABBARB"  Alcohol Level No results found for: "ETH"  CT Head without contrast(Personally reviewed): 10 mm acute intraparenchymal hemorrhage at the junction of the right lentiform nucleus and retrolenticular white matter.  Repeat CTH: pending  CT angio Head and Neck with contrast: pending  MRI Brain: pending  Impression   [name] [name] is a 76 y.o. male with PMH significant for history of ascending aortic dissection, chronic back pain and L5 fracture, degenerative disc disease, hypertension, hard of hearing and underlying dementia who had a fall on 08/14/2021.  He was seen by the PCP with no focal deficit on exam.  He had a screening outpatient CT head without contrast which demonstrated a small 10 mm acute  intraparenchymal hemorrhage at the junction of the right lentiform nucleus and the retrolenticular white matter.  I suspect that the bleed is likely incidental.  He has no focal neurological deficit on exam.  Underlying etiology is either hypertensive or potential lacunar stroke with hemorrhage.  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- SCD for DVT prophylaxis, pharmacological DVT ppx at 24 hours if ICH is stable - Blood pressure control with goal systolic [id number] 1226 cleverplex and labetalol PRN - Stroke labs, HgbA1c, fasting lipid panel - MRI brain with and without contrast when stabilized to evaluate for underlying mass - MRA without contrast of the brain and Vasc UKoreacarotid duplex to evaluate for underlying vascular abnormality. - Risk factor modification - Echocardiogram - PT consult, OT consult, Speech consult. - Stroke team to follow.   GERD: - continue PPI  Glaucoma: - continue home eye drops  HTN: - hold home meds. Will use hydralazine PRN for SBP over 150s.   This patient is critically ill and at significant risk of neurological worsening, death and care requires constant monitoring of vital signs, hemodynamics,respiratory and  cardiac monitoring, neurological assessment, discussion with family, other specialists and medical decision making of high complexity. I spent 40 minutes of neurocritical care time  in the care of  this patient. This was time spent independent of any time provided by nurse practitioner or PA.  Donnetta Simpers [facility] Pager Number [phone] 09/02/2021  9:25  PM   ______________________________________________________________________   Thank you for the opportunity to take part in the care of this patient. If you have any further questions, please contact the neurology consultation attending.  Signed,  [place] Pager Number [phone] _ _ _   _ __   _ __ _ _  __ __   _ __   __ _

## 2021-09-03 ENCOUNTER — Inpatient Hospital Stay (HOSPITAL_COMMUNITY): Payer: Medicare HMO

## 2021-09-03 DIAGNOSIS — I6389 Other cerebral infarction: Secondary | ICD-10-CM

## 2021-09-03 DIAGNOSIS — I610 Nontraumatic intracerebral hemorrhage in hemisphere, subcortical: Secondary | ICD-10-CM | POA: Diagnosis not present

## 2021-09-03 LAB — GLUCOSE, CAPILLARY
Glucose-Capillary: 80 mg/dL (ref 70–99)
Glucose-Capillary: 99 mg/dL (ref 70–99)
Glucose-Capillary: 105 mg/dL — ABNORMAL HIGH (ref 70–99)
Glucose-Capillary: 109 mg/dL — ABNORMAL HIGH (ref 70–99)
Glucose-Capillary: 143 mg/dL — ABNORMAL HIGH (ref 70–99)

## 2021-09-03 LAB — ECHOCARDIOGRAM COMPLETE
AR max vel: 3.21 cm2
AV Area VTI: 3.03 cm2
AV Area mean vel: 3.34 cm2
AV Mean grad: 10 mmHg
AV Peak grad: 19.8 mmHg
Ao pk vel: 2.23 m/s
Area-P 1/2: 3.11 cm2
S' Lateral: 3 cm

## 2021-09-03 MED ORDER — IOHEXOL 350 MG/ML SOLN
100.0000 mL | Freq: Once | INTRAVENOUS | Status: AC | PRN
  Administered 2021-09-03: 75 mL via INTRAVENOUS

## 2021-09-03 MED ORDER — CHLORHEXIDINE GLUCONATE CLOTH 2 % EX PADS
6.0000 | MEDICATED_PAD | Freq: Every day | CUTANEOUS | Status: DC
Start: 2021-09-03 — End: 2021-09-04
  Administered 2021-09-03: 6 via TOPICAL

## 2021-09-03 MED ORDER — GADOBUTROL 1 MMOL/ML IV SOLN
7.5000 mL | Freq: Once | INTRAVENOUS | Status: AC | PRN
  Administered 2021-09-03: 7.5 mL via INTRAVENOUS

## 2021-09-03 NOTE — TOC Initial Note (Signed)
Transition of Care [facility]) - Initial/Assessment Note    Patient Details  Name: [name] [name] MRN: [id number] Date of Birth: [birth year]  Transition of Care [facility]) CM/SW Contact:    Glennon Mac, RN Phone Number: 09/03/2021, 5:10 PM  Clinical Narrative:                 The pt is a 76 yo male presenting 8/3 after outpatient CT showed 10 mm acute intraparenchymal hemorrhage at the junction of the right lentiform nucleus and the retrolenticular white matter. PTA, pt required assistance with ADLS; he uses rollator or cane to ambulate.  He lives at home with wife and children, who are able to provide 24h assistance at dc.  PT and ST recommending OP follow up; referral made to [facility] for OP therapies.    Expected Discharge Plan: OP Rehab Barriers to Discharge: Continued Medical Work up          Expected Discharge Plan and Services Expected Discharge Plan: OP Rehab   Discharge Planning Services: CM Consult   Living arrangements for the past 2 months: Single Family Home                                      Prior Living Arrangements/Services Living arrangements for the past 2 months: Single Family Home Lives with:: Spouse, Adult Children Patient language and need for interpreter reviewed:: Yes        Need for Family Participation in Patient Care: Yes (Comment) Care giver support system in place?: Yes (comment)   Criminal Activity/Legal Involvement Pertinent to Current Situation/Hospitalization: No - Comment as needed  Activities of Daily Living   ADL Screening (condition at time of admission) Patient's cognitive ability adequate to safely complete daily activities?: Yes Is the patient deaf or have difficulty hearing?: No Does the patient have difficulty seeing, even when wearing glasses/contacts?: No Does the patient have difficulty concentrating, remembering, or making decisions?: No Patient able to express need for assistance with ADLs?: Yes Does the  patient have difficulty dressing or bathing?: Yes Independently performs ADLs?: No Communication: Independent Dressing (OT): Needs assistance Is this a change from baseline?: Change from baseline, expected to last <3days Grooming: Needs assistance Is this a change from baseline?: Change from baseline, expected to last >3 days Feeding: Independent Bathing: Needs assistance Is this a change from baseline?: Change from baseline, expected to last >3 days Toileting: Independent with device (comment) In/Out Bed: Needs assistance Is this a change from baseline?: Change from baseline, expected to last <3 days Walks in Home: Independent with device (comment) Does the patient have difficulty walking or climbing stairs?: Yes Weakness of Legs: None Weakness of Arms/Hands: None                 Emotional Assessment Appearance:: Appears stated age Attitude/Demeanor/Rapport: Engaged Affect (typically observed): Accepting Orientation: : Oriented to Self, Oriented to Place, Oriented to  Time, Oriented to Situation      Admission diagnosis:  ICH (intracerebral hemorrhage) (HCC) [I61.9] Patient Active Problem List   Diagnosis Date Noted   ICH (intracerebral hemorrhage) (HCC) 09/02/2021   Allergic rhinitis 04/14/2021   Chronic kidney disease, stage 3 unspecified (HCC) 04/14/2021   Chronic lumbar pain 04/14/2021   Diabetic renal disease (HCC) 04/14/2021   Gastroesophageal reflux disease 04/14/2021   Generalized anxiety disorder 04/14/2021   Hyperglycemia due to type 2 diabetes mellitus (HCC) 04/14/2021  Hypothyroidism 04/14/2021   Long term (current) use of inhaled steroids 04/14/2021   Long term (current) use of insulin (HCC) 04/14/2021   Major depression, single episode 04/14/2021   Neuropathy 04/14/2021   Poor appetite 04/14/2021   Presence of prosthetic heart valve 04/14/2021   Peripheral vascular disease (HCC) 04/14/2021   Pure hypercholesterolemia 04/14/2021   Urinary  incontinence 04/14/2021   Abdominal pain, LLQ (left lower quadrant) 03/14/2017   Endoleak post (EVAR) endovascular aneurysm repair, initial encounter 07/01/2015   S/P thoracic aortic aneurysm repair 01/19/2015   Glaucoma of both eyes 12/18/2013   Hypertrophic scar 12/02/2013   Mass of right side of neck 12/02/2013   Alzheimer's disease (HCC) 12/13/2012   Arthritis 12/13/2012   Headache 12/13/2012   Hyperlipidemia 12/13/2012   Memory impairment 12/13/2012   Multi-infarct dementia with depression (HCC) 12/13/2012   Heart disease 12/13/2012   Type 2 diabetes mellitus without complication (HCC) 12/13/2012   Aortic dissection (HCC) 11/02/2012   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/02/2012   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 [place] [address] Phone: [phone] Fax: [phone]     Social Determinants of Health (SDOH) Interventions    Readmission Risk Interventions     No data to display         Quintella Baton, RN, BSN  Trauma/Neuro ICU Case Manager [phone]

## 2021-09-03 NOTE — Progress Notes (Signed)
  Echocardiogram 2D Echocardiogram has been performed.  [name] [name] M 09/03/2021, 10:40 AM

## 2021-09-03 NOTE — Progress Notes (Signed)
OT Cancellation Note  Patient Details Name: [name] [name] MRN: [id number] DOB: 25-Apr-1945   Cancelled Treatment:    Reason Eval/Treat Not Completed: Patient at procedure or test/ unavailable. Pt with ECHO in room. Will attempt to see after ECHO completed.  Ignacia Palma, OTR/L [facility] [phone] Office [phone]    [name], [name] 09/03/2021, 9:51 AM

## 2021-09-03 NOTE — Evaluation (Signed)
Occupational Therapy Evaluation Patient Details Name: [name] [name] MRN: [id number] DOB: 09/22/1945 Today's Date: 09/03/2021   History of Present Illness The pt is a 76 yo male presenting 8/3 after outpatient CT showed 10 mm acute intraparenchymal hemorrhage at the junction of the right lentiform nucleus and the retrolenticular white matter. PMH includes: ascending aortic dissection, chronic back pain and L5 fracture, degenerative disc disease, hypertension, hard of hearing and underlying dementia.   Clinical Impression   This 76 yo male admitted with above presents to acute OT with PLOF of being S for basic ADLs. He currently is at a min guard-min A for basic ADLs when he is up on his feet with RW. He will continue to benefit from acute OT without need for follow up.     Recommendations for follow up therapy are one component of a multi-disciplinary discharge planning process, led by the attending physician.  Recommendations may be updated based on patient status, additional functional criteria and insurance authorization.   Follow Up Recommendations  No OT follow up    Assistance Recommended at Discharge Frequent or constant Supervision/Assistance  Patient can return home with the following A little help with walking and/or transfers;A little help with bathing/dressing/bathroom;Assistance with cooking/housework;Assist for transportation;Direct supervision/assist for financial management;Direct supervision/assist for medications management;Help with stairs or ramp for entrance    Functional Status Assessment  Patient has had a recent decline in their functional status and demonstrates the ability to make significant improvements in function in a reasonable and predictable amount of time.  Equipment Recommendations  Hospital bed (family requests)       Precautions / Restrictions Precautions Precautions: Fall Precaution Comments: fall on july 15 with head injury Restrictions Weight  Bearing Restrictions: No      Mobility Bed Mobility Overal bed mobility: Needs Assistance Bed Mobility: Supine to Sit           General bed mobility comments: increased time    Transfers Overall transfer level: Needs assistance Equipment used: Rolling walker (2 wheels), None Transfers: Sit to/from Stand Sit to Stand: Min guard           General transfer comment: min guard A with increased time to lower and rise, uses at least single UE to lower to chair and low toilet      Balance Overall balance assessment: History of Falls, Needs assistance Sitting-balance support: No upper extremity supported, Feet supported Sitting balance-Leahy Scale: Fair Sitting balance - Comments: able to lean outside of BOS   Standing balance support: Bilateral upper extremity supported, No upper extremity supported, During functional activity Standing balance-Leahy Scale: Fair Standing balance comment: pt with recent fall, drifting to R and mildly unsteady. able to ambulate short distance without UE support                           ADL either performed or assessed with clinical judgement   ADL Overall ADL's : Needs assistance/impaired Eating/Feeding: Independent;Sitting   Grooming: Min guard;Standing   Upper Body Bathing: Set up;Sitting   Lower Body Bathing: Min guard;Sit to/from stand   Upper Body Dressing : Set up;Sitting   Lower Body Dressing: Min guard;Sit to/from stand   Toilet Transfer: Minimal assistance;Ambulation;Rolling walker (2 wheels)   Toileting- Clothing Manipulation and Hygiene: Min guard;Sit to/from stand               Vision Baseline Vision/History: 0 No visual deficits Patient Visual Report: No change from baseline Vision  Assessment?: Vision impaired- to be further tested in functional context Additional Comments: pt was running into things on his right with RW while he ambulated in the hallway. With confrontional testing it did not appear he  has a visual field cut--will continue to asses in functional acitivities            Pertinent Vitals/Pain Pain Assessment Pain Assessment: Faces Faces Pain Scale: Hurts a little bit Pain Location: back and neck (chronic) Pain Descriptors / Indicators: Discomfort Pain Intervention(s): Limited activity within patient's tolerance, Monitored during session, Repositioned     Hand Dominance Right   Extremity/Trunk Assessment Upper Extremity Assessment Upper Extremity Assessment: Overall WFL for tasks assessed   Lower Extremity Assessment Lower Extremity Assessment: RLE deficits/detail;LLE deficits/detail RLE Deficits / Details: generalized tremors with movement, no clonus noted. grossly 4/5 LLE Deficits / Details: generalized tremors with movement, no clonus noted. grossly 4/5   Cervical / Trunk Assessment Cervical / Trunk Assessment: Kyphotic   Communication Communication Communication: HOH   Cognition Arousal/Alertness: Awake/alert Behavior During Therapy: WFL for tasks assessed/performed Overall Cognitive Status: History of cognitive impairments - at baseline                                 General Comments: pt with hx of dementia, able to follow simple commands with increased time. not oriented to place or situation (unsure what state or city he lives in)     General Comments  VSS on RA            Home Living Family/patient expects to be discharged to:: Private residence Living Arrangements: Spouse/significant other;Children Available Help at Discharge: Family;Available 24 hours/day Type of Home: House Home Access: Stairs to enter [employer] of Steps: 1 Entrance Stairs-Rails: None Home Layout: Multi-level;Able to live on main level with bedroom/bathroom     Bathroom Shower/Tub: Chief Strategy Officer: Standard     Home Equipment: Tub bench;Hand held Stage manager (4 wheels)          Prior  Functioning/Environment Prior Level of Function : Needs assist       Physical Assist : Mobility (physical);ADLs (physical)     Mobility Comments: per son, pt ambulating with rollator or cane in the home. likely supervision level ADLs Comments: per son, pt independent, using tub bench, likely supervision level        OT Problem List: Decreased cognition;Impaired balance (sitting and/or standing)      OT Treatment/Interventions: Self-care/ADL training;DME and/or AE instruction;Patient/family education;Balance training    OT Goals(Current goals can be found in the care plan section) Acute Rehab OT Goals Patient Stated Goal: to go home OT Goal Formulation: With patient Time For Goal Achievement: 09/17/21 Potential to Achieve Goals: Good  OT Frequency: Min 2X/week    Co-evaluation PT/OT/SLP Co-Evaluation/Treatment: Yes Reason for Co-Treatment: Necessary to address cognition/behavior during functional activity;For patient/therapist safety;To address functional/ADL transfers PT goals addressed during session: Mobility/safety with mobility;Balance;Strengthening/ROM OT goals addressed during session: Strengthening/ROM;ADL's and self-care      AM-PAC OT "6 Clicks" Daily Activity     Outcome Measure Help from another person eating meals?: None Help from another person taking care of personal grooming?: A Little Help from another person toileting, which includes using toliet, bedpan, or urinal?: A Little Help from another person bathing (including washing, rinsing, drying)?: A Little Help from another person to put on and taking off regular upper body clothing?: A Little  Help from another person to put on and taking off regular lower body clothing?: A Little 6 Click Score: 19   End of Session Equipment Utilized During Treatment: Gait belt;Rolling walker (2 wheels) Nurse Communication: Mobility status  Activity Tolerance: Patient tolerated treatment well Patient left: in chair;with  call bell/phone within reach;with chair alarm set  OT Visit Diagnosis: Unsteadiness on feet (R26.81);Other abnormalities of gait and mobility (R26.89);Repeated falls (R29.6);History of falling (Z91.81);Pain Pain - part of body:  (back and neck--chronic)                Time: 0258-5277 OT Time Calculation (min): 30 min Charges:  OT General Charges $OT Visit: 1 Visit OT Evaluation $OT Eval Moderate Complexity: 1 Mod  [name] [name], OTR/L Acute [employer] [facility] [phone] Office [phone]    [name] [name], [name] [name] 09/03/2021, 12:39 PM

## 2021-09-03 NOTE — Progress Notes (Addendum)
STROKE TEAM PROGRESS NOTE   INTERVAL HISTORY Patient is seen in his room with no family at the bedside.  He presented to the hospital after a screening outpatient head CT showed a small IPH at the junction of the right lentiform nucleus and retrolenticular white matter.  He is asymptomatic.  He had a recent fall on 7/15 with no injuries noted. 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  Patient does have a history of baseline dementia.  Blood pressure adequately controlled given of any blood pressure drips.  Neurological exam unchanged Vitals:   09/03/21 1000 09/03/21 1200 09/03/21 1215 09/03/21 1400  BP: (!) 150/80 (!) 140/71 (!) 140/71 (!) 123/103  Pulse: 76 82 81 98  Resp: (!) 21 20 (!) 21 (!) 29  Temp:  98.2 F (36.8 C)    TempSrc:      SpO2: 95% 95% 94% 91%   CBC:  Recent Labs  Lab 09/02/21 1940  WBC 7.3  NEUTROABS 4.8  HGB 12.5*  HCT 40.7  MCV 112.7*  PLT 177   Basic Metabolic Panel:  Recent Labs  Lab 09/02/21 1940  NA 143  K 4.4  CL 107  CO2 29  GLUCOSE 98  BUN 22  CREATININE 1.52*  CALCIUM 9.7   Lipid Panel: No results for input(s): "CHOL", "TRIG", "HDL", "CHOLHDL", "VLDL", "LDLCALC" in the last 168 hours. HgbA1c:  Recent Labs  Lab 09/02/21 1940  HGBA1C 4.5*   Urine Drug Screen: No results for input(s): "LABOPIA", "COCAINSCRNUR", "LABBENZ", "AMPHETMU", "THCU", "LABBARB" in the last 168 hours.  Alcohol Level No results for input(s): "ETH" in the last 168 hours.  IMAGING past 24 hours ECHOCARDIOGRAM COMPLETE  Result Date: 09/03/2021    ECHOCARDIOGRAM REPORT   Patient Name:   [name] [name] Date of Exam: 09/03/2021 Medical Rec #:  [id number]       Height:       66.0 in Accession #:    [id number]      Weight:       179.2 lb Date of Birth:  08/13/1945       BSA:          1.909 m Patient Age:    76 years         BP:           147/75 mmHg Patient Gender: M               HR:           77 bpm. Exam Location:  Inpatient Procedure: 2D Echo, 3D Echo, Cardiac Doppler and Color Doppler Indications:    Stroke I63.9  History:        Patient has prior history of Echocardiogram examinations, most                 recent 12/01/2020. PAD; Risk Factors:Hypertension and Former                 Smoker. TEVAR unknown date.                 Aortic Valve: Redo sternotomy avr with 88mm medtronic mosaid                 porcine valve and ascending aortic aneurysm repair N/A.                 Mediansternotomy for ascending aortic and hemi-arch repair  and                 aortic valve resuspension N/A 06/10/2007 valve is present in the                 aortic position. Procedure Date: 02/10/2011.  Sonographer:    Darlina Sicilian RDCS Referring Phys: [id number] [place]  1. Left ventricular ejection fraction, by estimation, is 60 to 65%. The left ventricle has normal function. The left ventricle has no regional wall motion abnormalities. There is mild left ventricular hypertrophy. Left ventricular diastolic parameters are consistent with Grade I diastolic dysfunction (impaired relaxation).  2. Right ventricular systolic function is normal. The right ventricular size is normal. Tricuspid regurgitation signal is inadequate for assessing PA pressure.  3. 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 Mean gradient 10 mmHg, no significant stenosis. No significant regurgitation noted.  4. Ascending aorta has been repaired/replaced. There is mild dilatation of the aortic root, measuring 40 mm.  5. The mitral valve is normal in structure. No evidence of mitral valve regurgitation. No evidence of mitral stenosis.  6. The inferior vena cava is normal in size with greater than 50% respiratory variability, suggesting right atrial pressure of 3 mmHg. FINDINGS  Left Ventricle: Left ventricular ejection fraction, by estimation, is 60 to  65%. The left ventricle has normal function. The left ventricle has no regional wall motion abnormalities. The left ventricular internal cavity size was normal in size. There is  mild left ventricular hypertrophy. Left ventricular diastolic parameters are consistent with Grade I diastolic dysfunction (impaired relaxation). Right Ventricle: The right ventricular size is normal. No increase in right ventricular wall thickness. Right ventricular systolic function is normal. Tricuspid regurgitation signal is inadequate for assessing PA pressure. Left Atrium: Left atrial size was normal in size. Right Atrium: Right atrial size was normal in size. Pericardium: There is no evidence of pericardial effusion. Mitral Valve: The mitral valve is normal in structure. No evidence of mitral valve regurgitation. No evidence of mitral valve stenosis. Tricuspid Valve: The tricuspid valve is normal in structure. Tricuspid valve regurgitation is not demonstrated. 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 Mean gradient 10 mmHg, no significant stenosis. No significant regurgitation noted. The aortic valve has been repaired/replaced. Aortic valve regurgitation is not visualized. Aortic valve mean gradient measures 10.0 mmHg. Aortic valve peak gradient measures 19.8 mmHg. Aortic valve area, by VTI measures 3.03 cm. There is a Redo sternotomy avr with 67mm medtronic mosaid porcine valve and ascending aortic aneurysm repair N/A. Mediansternotomy for ascending aortic and hemi-arch repair and aortic valve resuspension N/A 06/10/2007 valve present in the aortic position. Procedure Date: 02/10/2011. Pulmonic Valve: The pulmonic valve was normal in structure. Pulmonic valve regurgitation is trivial. Aorta: Aortic dilatation noted and the aortic root/ascending aorta has been repaired/replaced. There is mild dilatation of the aortic root, measuring 40 mm. Venous: The inferior vena cava is normal in size with greater  than 50% respiratory variability, suggesting right atrial pressure of 3 mmHg. IAS/Shunts: No atrial level shunt detected by color flow Doppler.  LEFT VENTRICLE PLAX 2D LVIDd:         3.50 cm   Diastology LVIDs:         3.00 cm   LV e' medial:    3.30 cm/s LV PW:         1.20 cm   LV E/e' medial:  20.8 LV IVS:        1.60 cm  LV e' lateral:   6.48 cm/s LVOT diam:     2.30 cm   LV E/e' lateral: 10.6 LV SV:         133 LV SV Index:   70 LVOT Area:     4.15 cm                           3D Volume EF:                          3D EF:        60 %                          LV EDV:       185 ml                          LV ESV:       74 ml                          LV SV:        111 ml RIGHT VENTRICLE RV S prime:     10.60 cm/s TAPSE (M-mode): 1.3 cm LEFT ATRIUM             Index        RIGHT ATRIUM           Index LA Vol (A2C):   34.3 ml 17.97 ml/m  RA Area:     14.60 cm LA Vol (A4C):   42.1 ml 22.06 ml/m  RA Volume:   29.80 ml  15.61 ml/m LA Biplane Vol: 38.2 ml 20.01 ml/m  AORTIC VALVE AV Area (Vmax):    3.21 cm AV Area (Vmean):   3.34 cm AV Area (VTI):     3.03 cm AV Vmax:           222.50 cm/s AV Vmean:          145.500 cm/s AV VTI:            0.440 m AV Peak Grad:      19.8 mmHg AV Mean Grad:      10.0 mmHg LVOT Vmax:         172.00 cm/s LVOT Vmean:        117.000 cm/s LVOT VTI:          0.321 m LVOT/AV VTI ratio: 0.73  AORTA Ao Root diam: 4.00 cm MITRAL VALVE MV Area (PHT): 3.11 cm    SHUNTS MV Decel Time: 244 msec    Systemic VTI:  0.32 m MV E velocity: 68.60 cm/s  Systemic Diam: 2.30 cm MV A velocity: 96.80 cm/s MV E/A ratio:  0.71 Dalton McleanMD Electronically signed by Franki Monte Signature Date/Time: 09/03/2021/2:33:26 PM    Final    CT ANGIO HEAD NECK W WO CM  Result Date: 09/03/2021 CLINICAL DATA:  Hemorrhage follow-up EXAM: CT ANGIOGRAPHY HEAD AND NECK TECHNIQUE: Multidetector CT imaging of the head and neck was performed using the standard protocol during bolus administration of intravenous  contrast. Multiplanar CT image reconstructions and MIPs were obtained to evaluate the vascular anatomy. Carotid stenosis measurements (when applicable) are obtained utilizing NASCET criteria, using the distal internal carotid diameter as the denominator. RADIATION DOSE REDUCTION: This exam was performed according to the departmental dose-optimization program which includes  automated exposure control, adjustment of the mA and/or kV according to patient size and/or use of iterative reconstruction technique. CONTRAST:  76mL OMNIPAQUE IOHEXOL 350 MG/ML SOLN COMPARISON:  None Available. FINDINGS: CT HEAD FINDINGS Brain: Unchanged size of small focus of intraparenchymal hemorrhage at the posterior aspect of the right lentiform nucleus. There is generalized atrophy without lobar predilection. Multiple old cerebellar infarcts. There is hypoattenuation of the periventricular white matter, most commonly indicating chronic ischemic microangiopathy. Skull: The visualized skull base, calvarium and extracranial soft tissues are normal. Sinuses/Orbits: No fluid levels or advanced mucosal thickening of the visualized paranasal sinuses. No mastoid or middle ear effusion. The orbits are normal. CTA NECK FINDINGS SKELETON: There is no bony spinal canal stenosis. No lytic or blastic lesion. OTHER NECK: Normal pharynx, larynx and major salivary glands. No cervical lymphadenopathy. Unremarkable thyroid gland. UPPER CHEST: No pneumothorax or pleural effusion. No nodules or masses. AORTIC ARCH: There is an endovascular thoracic aortic repair. 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 RIGHT CAROTID SYSTEM: No dissection, occlusion or aneurysm. Mild atherosclerotic calcification at the carotid bifurcation without hemodynamically significant stenosis. LEFT CAROTID SYSTEM: No dissection, occlusion or aneurysm. Mild atherosclerotic calcification at the carotid bifurcation without hemodynamically  significant stenosis. VERTEBRAL ARTERIES: Right dominant configuration. There is no opacification of the left V1 segment or proximal V2 segment. The distal left V2 and V3 segments are normal. This is likely due to altered flow in the setting of left subclavian bypass. The right vertebral artery is normal. CTA HEAD FINDINGS POSTERIOR CIRCULATION: --Vertebral arteries: Normal V4 segments. --Inferior cerebellar arteries: Normal. --Basilar artery: Normal. --Superior cerebellar arteries: Normal. --Posterior cerebral arteries (PCA): Normal. ANTERIOR CIRCULATION: --Intracranial internal carotid arteries: Normal. --Anterior cerebral arteries (ACA): Normal. Both A1 segments are present. Patent anterior communicating artery (a-comm). --Middle cerebral arteries (MCA): Normal. VENOUS SINUSES: As permitted by contrast timing, patent. ANATOMIC VARIANTS: None Review of the MIP images confirms the above findings. IMPRESSION: 1. Unchanged size of small focus of intraparenchymal hemorrhage at the posterior aspect of the right lentiform nucleus. 2. No intracranial arterial occlusion or high-grade stenosis. No intracranial vascular lesion. 3. 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 Electronically Signed   By: Ulyses Jarred M.D.   On: 09/03/2021 02:32   MR BRAIN W WO CONTRAST  Result Date: 09/03/2021 CLINICAL DATA:  Acute neurologic deficit EXAM: MRI HEAD WITHOUT AND WITH CONTRAST TECHNIQUE: Multiplanar, multiecho pulse sequences of the brain and surrounding structures were obtained without and with intravenous contrast. CONTRAST:  7.80mL GADAVIST GADOBUTROL 1 MMOL/ML IV SOLN COMPARISON:  None Available. FINDINGS: Brain: No acute infarct, mass effect or extra-axial collection. Multiple chronic microhemorrhages in a mixed central and peripheral pattern. There is multifocal hyperintense T2-weighted signal within the white matter. There is advanced atrophy. There are old  right cerebellar and basal ganglia small vessel infarcts. The midline structures are normal. There is no abnormal contrast enhancement. Vascular: Major flow voids are preserved. Skull and upper cervical spine: Normal calvarium and skull base. Visualized upper cervical spine and soft tissues are normal. Sinuses/Orbits:No paranasal sinus fluid levels or advanced mucosal thickening. No mastoid or middle ear effusion. Normal orbits. IMPRESSION: 1. No acute intracranial abnormality. 2. 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 3. Multiple chronic microhemorrhages in a mixed central and peripheral pattern, which may be due to chronic hypertensive angiopathy or cerebral amyloid angiopathy. Electronically Signed  By: Ulyses Jarred M.D.   On: 09/03/2021 02:17    PHYSICAL EXAM General:  Alert, well-nourished, well-developed patient in no acute distress Respiratory:  Regular, unlabored respirations on room air.  NEURO:  Mental Status: AA&Ox3, 3/3 registration, 1/3 recall. Able to name 4 animals with 4 feet Speech/Language: speech is without dysarthria or aphasia.  Fluency, and comprehension intact.  Clock drawing 4/4.  Cranial Nerves:  II: PERRL.  III, IV, VI: EOMI. Eyelids elevate symmetrically.  V: Sensation is intact to light touch and symmetrical to face.  VII: Smile is symmetrical.   VIII: hearing intact to voice. IX, X: Phonation is normal.  XII: tongue is midline without fasciculations. Motor: 5/5 strength to all muscle groups tested.  Tone: is normal and bulk is normal Sensation- Intact to light touch bilaterally.  Coordination: FTN intact bilaterally.No drift.  Gait- deferred   ASSESSMENT/PLAN [name] [name] is a 76 y.o. male with history of dissection of the ascending aorta, L5 fracture, DDD, HTN and dementia presenting after a screening outpatient head CT showed a small IPH at the junction of the right  lentiform nucleus and retrolenticular white matter.  He is asymptomatic.  He had a recent fall on 7/15 with no injuries noted.  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 Yes with Assist; Fluid consistency: Thin   aspirin 81 mg daily prior to admission, now on No antithrombotic secondary to [place] Therapy recommendations:  outpatient PT, OT pending Disposition:  pending  Hypertension Home meds:  metoprolol XL 100 mg daily Stable Keep SBP <160 Long-term BP goal normotensive  Hyperlipidemia Home meds: none LDL pending., goal < 70 Add statin at discharge if indicated  Continue statin at discharge  Diabetes type II Controlled Home meds:  metformin 1000 mg BID HgbA1c 4.5, goal < 7.0 CBGs Recent Labs    09/03/21 0326 09/03/21 0755 09/03/21 1213  GLUCAP 80 99 105*    SSI  Other Stroke Risk Factors Advanced Age >/= 15  Former Cigarette smoker Hx stroke  Other Active Problems Dementia Request cognitive evaluation from [facility] day # [place] , MSN, AGACNP-BC [facility] See Amion for schedule and pager information 09/03/2021 3:30 PM  I have personally obtained history,examined this patient, reviewed notes, independently viewed imaging studies, participated in medical decision  making and plan of care.ROS completed by me personally and pertinent positives fully documented  I have made any additions or clarifications directly to the above note. Agree with note above.  Patient presented with abnormal CT scan and outside hospital showing small right subcortical subacute hemorrhage without significant mass effect or midline shift and intraventricular extension.  MRI shows stable appearance of the hemorrhage along with several small remote microhemorrhages compatible with hypertensive small vessel disease or amyloid angiopathy.  Continue close neurological monitoring and strict blood pressure control  with systolic goal below 160.  Mobilize out of bed.  Therapy consults.  Hold antiplatelet agents.  No family available at the bedside for discussion.This patient is critically ill and at significant risk of neurological worsening, death and care requires constant monitoring of vital signs, hemodynamics,respiratory and cardiac monitoring, extensive review of multiple databases, frequent neurological assessment, discussion with family, other specialists and medical decision making of high complexity.I have made any additions or clarifications directly to the above note.This critical care time does not reflect procedure time, or teaching time or supervisory time of PA/NP/Med Resident etc but could involve care discussion time.  I spent 30 minutes of neurocritical care time  in the care of  this patient.      Delia Heady, MD Medical Director [facility] Pager: [phone] 09/03/2021 5:07 PM   To contact Stroke Continuity provider, please refer to [email or web address]. After hours, contact General Neurology

## 2021-09-03 NOTE — Evaluation (Signed)
Physical Therapy Evaluation Patient Details Name: [name] [name] MRN: [id number] DOB: November 05, 1945 Today's Date: 09/03/2021  History of Present Illness  The pt is a 76 yo male presenting 8/3 after outpatient CT showed 10 mm acute intraparenchymal hemorrhage at the junction of the right lentiform nucleus and the retrolenticular white matter. PMH includes: ascending aortic dissection, chronic back pain and L5 fracture, degenerative disc disease, hypertension, hard of hearing and underlying dementia.   Clinical Impression  Pt in bed upon arrival of PT, agreeable to evaluation at this time. Prior to admission the pt was ambulating with use of rollator independently in the home, has been receiving HHPT for ~1 year for LE strength and endurance. Per pt's son, he is able to walk 5-10 min with HHPT at baseline and is independent with ADLs. The pt now presents with limitations in functional mobility, stability, endurance, and awareness due to above dx, and will continue to benefit from skilled PT to address these deficits. The pt was able to complete bed mobility and sit-stand without physical assist, but did have initial posterior lean needing minA to correct. After ~45 ft hallway ambulation with minG and RW, the pt completed 15 ft ambulation in room with no UE support and minG. He is mildly unsteady and does drift to R with ambulation, running into multiple objects in hall and room. Given deficits, will benefit from continued skilled PT to further challenge balance, strength, and power to further improve pt independence and reduce fall risk.         Recommendations for follow up therapy are one component of a multi-disciplinary discharge planning process, led by the attending physician.  Recommendations may be updated based on patient status, additional functional criteria and insurance authorization.  Follow Up Recommendations Outpatient PT      Assistance Recommended at Discharge Intermittent  Supervision/Assistance  Patient can return home with the following  A little help with walking and/or transfers;A little help with bathing/dressing/bathroom;Direct supervision/assist for medications management;Direct supervision/assist for financial management;Assist for transportation;Help with stairs or ramp for entrance    Equipment Recommendations None recommended by PT (pt has needed DME)  Recommendations for Other Services       Functional Status Assessment Patient has had a recent decline in their functional status and demonstrates the ability to make significant improvements in function in a reasonable and predictable amount of time.     Precautions / Restrictions Precautions Precautions: Fall Precaution Comments: fall on july 15 with head injury Restrictions Weight Bearing Restrictions: No      Mobility  Bed Mobility Overal bed mobility: Needs Assistance Bed Mobility: Supine to Sit     Supine to sit: Min guard     General bed mobility comments: increased time    Transfers Overall transfer level: Needs assistance Equipment used: Rolling walker (2 wheels), None Transfers: Sit to/from Stand Sit to Stand: Min guard           General transfer comment: minG with increased time to rise, uses at least single UE to lower to chair and low toilet    Ambulation/Gait Ambulation/Gait assistance: Min guard Gait Distance (Feet): 45 Feet (+ 62ft) Assistive device: Rolling walker (2 wheels), None Gait Pattern/deviations: Step-through pattern, Drifts right/left Gait velocity: decreased     General Gait Details: pt with small steps, mild instability, drifting to R and ran into x6 objects on R. improved stability with no UE support  Modified Rankin (Stroke Patients Only) Modified Rankin (Stroke Patients Only) Pre-Morbid Rankin Score: Moderate  disability Modified Rankin: Moderately severe disability     Balance Overall balance assessment: History of Falls, Needs  assistance Sitting-balance support: No upper extremity supported, Feet supported Sitting balance-Leahy Scale: Fair Sitting balance - Comments: able to lean outside of BOS   Standing balance support: Bilateral upper extremity supported, No upper extremity supported, During functional activity Standing balance-Leahy Scale: Fair Standing balance comment: pt with recent fall, drifting to R and mildly unsteady. able to ambulate short distance without UE support                             Pertinent Vitals/Pain Pain Assessment Pain Assessment: Faces Faces Pain Scale: Hurts a little bit Pain Location: back and neck (chronic) Pain Descriptors / Indicators: Discomfort Pain Intervention(s): Limited activity within patient's tolerance, Monitored during session, Repositioned    Home Living Family/patient expects to be discharged to:: Private residence Living Arrangements: Spouse/significant other;Children Available Help at Discharge: Family Type of Home: House Home Access: Stairs to enter Entrance Stairs-Rails: None Entrance Stairs-Number of Steps: 1   Home Layout: Multi-level;Able to live on main level with bedroom/bathroom Home Equipment: Tub bench;Hand held shower head;Rollator (4 wheels)      Prior Function Prior Level of Function : Needs assist       Physical Assist : Mobility (physical);ADLs (physical)     Mobility Comments: per son, pt ambulating with rollator or cane in the home. likely supervision level ADLs Comments: per son, pt independent, using tub bench, likely supervision level     Hand Dominance   Dominant Hand: Right    Extremity/Trunk Assessment   Upper Extremity Assessment Upper Extremity Assessment: Defer to OT evaluation    Lower Extremity Assessment Lower Extremity Assessment: RLE deficits/detail;LLE deficits/detail RLE Deficits / Details: generalized tremors with movement, no clonus noted. grossly 4/5 LLE Deficits / Details: generalized  tremors with movement, no clonus noted. grossly 4/5    Cervical / Trunk Assessment Cervical / Trunk Assessment: Kyphotic  Communication   Communication: HOH  Cognition Arousal/Alertness: Awake/alert Behavior During Therapy: WFL for tasks assessed/performed Overall Cognitive Status: History of cognitive impairments - at baseline                                 General Comments: pt with hx of dementia, able to follow simple commands with increased time. not oriented to place or situation (unsure what state or city he lives in)        General Comments General comments (skin integrity, edema, etc.): VSS on RA    Exercises     Assessment/Plan    PT Assessment Patient needs continued PT services  PT Problem List Decreased strength;Decreased activity tolerance;Decreased balance;Decreased mobility;Decreased coordination       PT Treatment Interventions DME instruction;Gait training;Stair training;Functional mobility training;Therapeutic activities;Therapeutic exercise;Balance training;Patient/family education;Neuromuscular re-education    PT Goals (Current goals can be found in the Care Plan section)  Acute Rehab PT Goals Patient Stated Goal: return home, keep his strength and continue exercises PT Goal Formulation: With patient Time For Goal Achievement: 09/17/21 Potential to Achieve Goals: Good    Frequency Min 3X/week     Co-evaluation PT/OT/SLP Co-Evaluation/Treatment: Yes Reason for Co-Treatment: Necessary to address cognition/behavior during functional activity;For patient/therapist safety;To address functional/ADL transfers PT goals addressed during session: Mobility/safety with mobility;Balance;Strengthening/ROM         AM-PAC PT "6 Clicks" Mobility  Outcome Measure Help  needed turning from your back to your side while in a flat bed without using bedrails?: A Little Help needed moving from lying on your back to sitting on the side of a flat bed  without using bedrails?: A Little Help needed moving to and from a bed to a chair (including a wheelchair)?: A Little Help needed standing up from a chair using your arms (e.g., wheelchair or bedside chair)?: A Little Help needed to walk in hospital room?: A Little Help needed climbing 3-5 steps with a railing? : A Little 6 Click Score: 18    End of Session Equipment Utilized During Treatment: Gait belt Activity Tolerance: Patient tolerated treatment well Patient left: in chair;with call bell/phone within reach;with chair alarm set Nurse Communication: Mobility status PT Visit Diagnosis: Other abnormalities of gait and mobility (R26.89);Muscle weakness (generalized) (M62.81);History of falling (Z91.81)    Time: 5537-4827 PT Time Calculation (min) (ACUTE ONLY): 29 min   Charges:   PT Evaluation $PT Eval Low Complexity: 1 Low          Vickki Muff, PT, DPT   Acute Rehabilitation Department  Ronnie Derby 09/03/2021, 11:25 AM

## 2021-09-03 NOTE — Evaluation (Signed)
Speech Language Pathology Evaluation Patient Details Name: [name] [name] MRN: [id number] DOB: March 26, 1945 Today's Date: 09/03/2021 Time: 5732-2025 SLP Time Calculation (min) (ACUTE ONLY): 21 min  Problem List:  Patient Active Problem List   Diagnosis Date Noted   ICH (intracerebral hemorrhage) (HCC) 09/02/2021   Allergic rhinitis 04/14/2021   Chronic kidney disease, stage 3 unspecified (HCC) 04/14/2021   Chronic lumbar pain 04/14/2021   Diabetic renal disease (HCC) 04/14/2021   Gastroesophageal reflux disease 04/14/2021   Generalized anxiety disorder 04/14/2021   Hyperglycemia due to type 2 diabetes mellitus (HCC) 04/14/2021   Hypothyroidism 04/14/2021   Long term (current) use of inhaled steroids 04/14/2021   Long term (current) use of insulin (HCC) 04/14/2021   Major depression, single episode 04/14/2021   Neuropathy 04/14/2021   Poor appetite 04/14/2021   Presence of prosthetic heart valve 04/14/2021   Peripheral vascular disease (HCC) 04/14/2021   Pure hypercholesterolemia 04/14/2021   Urinary incontinence 04/14/2021   Abdominal pain, LLQ (left lower quadrant) 03/14/2017   Endoleak post (EVAR) endovascular aneurysm repair, initial encounter 07/01/2015   S/P thoracic aortic aneurysm repair 01/19/2015   Glaucoma of both eyes 12/18/2013   Hypertrophic scar 12/02/2013   Mass of right side of neck 12/02/2013   Alzheimer's disease (HCC) 12/13/2012   Arthritis 12/13/2012   Headache 12/13/2012   Hyperlipidemia 12/13/2012   Memory impairment 12/13/2012   Multi-infarct dementia with depression (HCC) 12/13/2012   Heart disease 12/13/2012   Type 2 diabetes mellitus without complication (HCC) 12/13/2012   Aortic dissection (HCC) 11/02/2012   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/02/2012   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   Chronic thoracic aortic dissection (HCC) 11/10/2010   Past Medical History:  Past Medical History:  Diagnosis Date   Ascending aortic  dissection (HCC) 06/10/2007   acute type A dissection with pericardial tamponade   Chronic back pain    Chronic chest pain    Descending thoracic aortic dissection (HCC) 03/24/2009   acute type B aortic dissecton   DJD (degenerative joint disease), lumbosacral    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 male. who presenting secondary to abnormal CT scan. Pt had a fall a weeks prior to admission and had been off balance since. Pt's PCP recommended an outpatient CT scan which revealed a 10 mm acute parenchymal hemorrhage at the junction of the right lentiform nucleus and retrolenticular white matter. Pt passed the [place] and then subsequently failed. PMH: ascending aortic dissection, chronic back pain and L5 fracture, degenerative disc disease, hypertension, hard of hearing and dementia.   Assessment / Plan / Recommendation Clinical Impression  Pt participated in speech-language-cognition evaluation. Pt reported that he lives with his wife who manages his medications and finances. Pt's son was contacted via phone. He reported that the pt's cognition was tested one year prior in [place], [place] and that it was "very very mild" at that time. Per the son, pt's processing speed has been slower since the fall. [facility] Mental Status Examination was completed to evaluate the pt's cognitive-linguistic skills. He achieved a score of  10/30 which is below the normal limits of 27 or more out of 30. He exhibited difficulty in the areas of awareness, attention, memory, problem solving, and executive function. Motor  speech skills were WFL. Pt's auditory comprehension was negatively impacted by his hearing aids not being at the hospital and by impairments in memory and attention. Acute SLP services will be continued at this time, but with potential for discontinuation if pt's family subsequently believes that pt's cognitive presentation is representative of his baseline.     SLP Assessment  SLP Recommendation/Assessment: Patient needs continued Speech Lanaguage Pathology Services SLP Visit Diagnosis: Cognitive communication deficit (R41.841)    Recommendations for follow up therapy are one component of a multi-disciplinary discharge planning process, led by the attending physician.  Recommendations may be updated based on patient status, additional functional criteria and insurance authorization.    Follow Up Recommendations  Outpatient SLP    Assistance Recommended at Discharge  Frequent or constant Supervision/Assistance  Functional Status Assessment Patient has had a recent decline in their functional status and demonstrates the ability to make significant improvements in function in a reasonable and predictable amount of time.  Frequency and Duration min 2x/week  2 weeks      SLP Evaluation Cognition  Overall Cognitive Status: Impaired/Different from baseline Arousal/Alertness: Awake/alert Orientation Level: Oriented to person;Oriented to place;Oriented to time;Oriented to situation Year: 2023 Month: August Day of Week: Correct Attention: Focused;Sustained;Selective Focused Attention: Appears intact Sustained Attention: Impaired Sustained Attention Impairment: Verbal complex Selective Attention: Impaired Selective Attention Impairment: Verbal basic Memory: Impaired Memory Impairment:  (Immediate: 5/5 with repetition x2; delayed: 1/5; with cues: 3/4) Awareness: Impaired Awareness Impairment: Intellectual impairment Problem Solving: Impaired Problem Solving Impairment: Verbal complex (Time:  0/1; money: 1/3) Executive Function: Sequencing;Organizing Sequencing: Impaired Sequencing Impairment: Verbal complex (clock drawing: 0/4) Organizing:  (backward digit span: 0/2)       Comprehension  Auditory Comprehension Overall Auditory Comprehension: Appears within functional limits for tasks assessed Yes/No Questions: Within Functional Limits Commands: Within Functional Limits Interfering Components: Working memory;Processing speed;Attention    Expression Expression Primary Mode of Expression: Verbal Verbal Expression Level of Generative/Spontaneous Verbalization: Conversation Interfering Components: Attention Written Expression Dominant Hand: Right   Oral / Motor  Oral Motor/Sensory Function Overall Oral Motor/Sensory Function: Within functional limits Motor Speech Overall Motor Speech: Appears within functional limits for tasks assessed Respiration: Within functional limits Phonation: Normal Resonance: Within functional limits Articulation: Impaired Level of Impairment: Conversation Intelligibility: Intelligible           Drayven Marchena I. Vear Clock, MS, CCC-SLP Acute Rehabilitation Services Office number [phone]  Scheryl Marten 09/03/2021, 12:40 PM

## 2021-09-03 NOTE — Evaluation (Signed)
Clinical/Bedside Swallow Evaluation Patient Details  Name: [name] [name] MRN: [id number] Date of Birth: 1946/01/06  Today's Date: 09/03/2021 Time: SLP Start Time (ACUTE ONLY): 1134 SLP Stop Time (ACUTE ONLY): 1146 SLP Time Calculation (min) (ACUTE ONLY): 12 min  Past Medical History:  Past Medical History:  Diagnosis Date   Ascending aortic dissection (HCC) 06/10/2007   acute type A dissection with pericardial tamponade   Chronic back pain    Chronic chest pain    Descending thoracic aortic dissection (HCC) 03/24/2009   acute type B aortic dissecton   DJD (degenerative joint disease), lumbosacral    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 male. who presenting secondary to abnormal CT scan. [name] [name] had a fall a weeks prior to admission and had been off balance since. [name] [name]'s PCP recommended an outpatient CT scan which revealed a 10 mm acute parenchymal hemorrhage at the junction of the right lentiform nucleus and retrolenticular white matter. [name] [name] passed the [place] and then subsequently failed. PMH: ascending aortic dissection, chronic back pain and L5 fracture, degenerative disc disease, hypertension, hard of hearing and dementia.    Assessment / Plan / Recommendation  Clinical Impression  [name] [name] was seen for bedside swallow evaluation. He stated that he sometimes "get choked" when he takes too large of a pill or takes more than 4-5 pills at once. [name] [name]'s son stated that the [name] [name] sometimes avoids very hard foods since his dentures don't fit as well as they used to. [name] [name]'s RN reported that the [name] [name] had difficulty with a Tylenol  pill last night. Oral mechanism exam was [facility] and he presented with full dentures. [name] [name] tolerated all solids and liquids without signs or symptoms of oropharyngeal dysphagia. A regular texture diet with thin liquids is recommended at this time and SLP will follow briefly to ensure tolerance. SLP Visit Diagnosis: Dysphagia, unspecified (R13.10)    Aspiration Risk  No limitations    Diet Recommendation Regular;Thin liquid   Liquid Administration via: Cup;Straw Medication Administration: Whole meds with puree Supervision: Patient able to self feed Compensations: Slow rate Postural Changes: Seated upright at 90 degrees    Other  Recommendations Oral Care Recommendations: Oral care BID    Recommendations for follow up therapy are one component of a multi-disciplinary discharge planning process, led by the attending physician.  Recommendations may be updated based on patient status, additional functional criteria and insurance authorization.  Follow up Recommendations Outpatient SLP      Assistance Recommended at Discharge Frequent or constant Supervision/Assistance  Functional Status Assessment Patient has had a recent decline in their functional status and demonstrates the ability to make significant improvements in function in a reasonable and predictable amount of time.  Frequency and Duration min 1 x/week  1 week       Prognosis Prognosis for Safe Diet Advancement: Good      Swallow Study   General Date of Onset: 09/02/21 HPI: [name] [name] is a 76 y.o. male. who presenting secondary to abnormal CT scan. [name] [name] had a fall a weeks prior to admission and had been off balance since. [name] [name]'s PCP  recommended an outpatient CT scan which revealed a 10 mm acute parenchymal hemorrhage at the junction of the right lentiform nucleus and retrolenticular white matter. [name] [name] passed the [place] and then subsequently failed. PMH: ascending aortic dissection, chronic back pain and L5 fracture, degenerative disc disease,  hypertension, hard of hearing and dementia. Type of Study: Bedside Swallow Evaluation Previous Swallow Assessment: none Diet Prior to this Study: NPO Temperature Spikes Noted: No Respiratory Status: Room air History of Recent Intubation: No Behavior/Cognition: Alert;Cooperative;Pleasant mood Oral Cavity Assessment: Within Functional Limits Oral Care Completed by SLP: No Oral Cavity - Dentition: Dentures, top;Dentures, bottom Vision: Functional for self-feeding Self-Feeding Abilities: Able to feed self Patient Positioning: Upright in chair;Postural control adequate for testing Baseline Vocal Quality: Normal Volitional Cough: Strong Volitional Swallow: Able to elicit    Oral/Motor/Sensory Function Overall Oral Motor/Sensory Function: Within functional limits   Ice Chips Ice chips: Not tested   Thin Liquid Thin Liquid: Within functional limits Presentation: Straw    Nectar Thick Nectar Thick Liquid: Not tested   Honey Thick Honey Thick Liquid: Not tested   Puree Puree: Within functional limits Presentation: Spoon   Solid     Solid: Within functional limits Presentation: Self Fed     Shayana Hornstein I. Vear Clock, MS, CCC-SLP Acute Rehabilitation Services Office number [phone]  Scheryl Marten 09/03/2021,12:27 PM

## 2021-09-03 NOTE — Progress Notes (Signed)
PT Cancellation Note  Patient Details Name: [name] [name] MRN: [id number] DOB: 02-18-1945   Cancelled Treatment:    Reason Eval/Treat Not Completed: Active bedrest order remains at this time. Per order set, pt with 24 hours bedrest that expires 8/4 at 1938. Will continue to follow for PT evaluation as appropriate.   Vickki Muff, PT, DPT   Acute Rehabilitation Department   Ronnie Derby 09/03/2021, 7:35 AM

## 2021-09-04 DIAGNOSIS — I10 Essential (primary) hypertension: Secondary | ICD-10-CM

## 2021-09-04 DIAGNOSIS — I610 Nontraumatic intracerebral hemorrhage in hemisphere, subcortical: Secondary | ICD-10-CM | POA: Diagnosis not present

## 2021-09-04 DIAGNOSIS — I680 Cerebral amyloid angiopathy: Secondary | ICD-10-CM

## 2021-09-04 LAB — BASIC METABOLIC PANEL
Anion gap: 8 (ref 5–15)
BUN: 21 mg/dL (ref 8–23)
CO2: 25 mmol/L (ref 22–32)
Calcium: 9.1 mg/dL (ref 8.9–10.3)
Chloride: 105 mmol/L (ref 98–111)
Creatinine, Ser: 1.33 mg/dL — ABNORMAL HIGH (ref 0.61–1.24)
GFR, Estimated: 56 mL/min — ABNORMAL LOW (ref >60)
Glucose, Bld: 96 mg/dL (ref 70–99)
Potassium: 4.3 mmol/L (ref 3.5–5.1)
Sodium: 138 mmol/L (ref 135–145)

## 2021-09-04 LAB — RAPID URINE DRUG SCREEN, HOSP PERFORMED
Amphetamines: NOT DETECTED
Barbiturates: NOT DETECTED
Benzodiazepines: NOT DETECTED
Cocaine: NOT DETECTED
Opiates: NOT DETECTED
Tetrahydrocannabinol: NOT DETECTED

## 2021-09-04 LAB — GLUCOSE, CAPILLARY
Glucose-Capillary: 100 mg/dL — ABNORMAL HIGH (ref 70–99)
Glucose-Capillary: 100 mg/dL — ABNORMAL HIGH (ref 70–99)
Glucose-Capillary: 112 mg/dL — ABNORMAL HIGH (ref 70–99)
Glucose-Capillary: 134 mg/dL — ABNORMAL HIGH (ref 70–99)
Glucose-Capillary: 85 mg/dL (ref 70–99)

## 2021-09-04 LAB — LIPID PANEL
Cholesterol: 94 mg/dL (ref 0–200)
HDL: 37 mg/dL — ABNORMAL LOW (ref >40)
LDL Cholesterol: 47 mg/dL (ref 0–99)
Total CHOL/HDL Ratio: 2.5 RATIO
Triglycerides: 52 mg/dL (ref <150)
VLDL: 10 mg/dL (ref 0–40)

## 2021-09-04 LAB — TSH: TSH: 1.298 u[IU]/mL (ref 0.350–4.500)

## 2021-09-04 LAB — RPR: RPR Ser Ql: NONREACTIVE

## 2021-09-04 LAB — VITAMIN B12: Vitamin B-12: 399 pg/mL (ref 180–914)

## 2021-09-04 MED ORDER — PANTOPRAZOLE SODIUM 40 MG PO TBEC: 40.0000 mg | DELAYED_RELEASE_TABLET | Freq: Every day | ORAL | Status: DC

## 2021-09-04 NOTE — Discharge Summary (Addendum)
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  Other active problem: HTN HLD DM Dementia  History of aortic dissection   Allergies as of 09/04/2021   No Known Allergies      Medication List     STOP taking these medications    Anoro Ellipta 62.5-25 MCG/ACT Aepb Generic drug: umeclidinium-vilanterol   aspirin EC 81 MG tablet   Lantus SoloStar 100 UNIT/ML Solostar Pen Generic drug: insulin glargine   metoprolol tartrate 25 MG tablet Commonly known as: LOPRESSOR       TAKE these medications    albuterol (2.5 MG/3ML) 0.083% nebulizer solution Commonly known as: PROVENTIL Take 2.5 mg by nebulization every 4 (four) hours as needed for wheezing or shortness of breath.   azelastine 0.1 % nasal spray Commonly known as: ASTELIN Place 1 spray into both nostrils 2 (two) times daily.   brimonidine 0.2 % ophthalmic solution Commonly known as: ALPHAGAN Place 1 drop into both eyes 3 (three) times daily.   celecoxib 200 MG capsule Commonly known as: CELEBREX Take 200 mg by mouth daily.   desvenlafaxine 100 MG 24 hr tablet Commonly known as: PRISTIQ Take 100 mg by mouth daily.   docusate sodium 100 MG capsule Commonly known as: COLACE Take 200 mg by mouth daily as needed for mild constipation.   gabapentin 100 MG capsule Commonly known as: NEURONTIN Take 100 mg by mouth 3 (three) times daily as needed (nerve pain).   ipratropium 0.06 % nasal spray Commonly known as: ATROVENT Place 2 sprays into both nostrils daily.   latanoprost 0.005 % ophthalmic solution Commonly known as: XALATAN Place 1 drop into both eyes at  bedtime.   levocetirizine 5 MG tablet Commonly known as: XYZAL Take 1 tablet (5 mg total) by mouth 2 (two) times daily as needed for allergies.   levothyroxine 25 MCG tablet Commonly known as: SYNTHROID Take 25 mcg by mouth daily.   metFORMIN 1000 MG tablet Commonly known as: GLUCOPHAGE Take 1,000 mg by mouth 2 (two) times daily.   metoprolol succinate 100 MG 24 hr tablet Commonly known as: TOPROL-XL Take 100 mg by mouth at bedtime.   montelukast 10 MG tablet Commonly known as: SINGULAIR Take 10 mg by mouth daily.   omeprazole 20 MG capsule Commonly known as: PRILOSEC Take 20 mg by mouth daily before breakfast.   rosuvastatin 20 MG tablet Commonly known as: CRESTOR Take 20 mg by mouth at bedtime.   Trelegy Ellipta 100-62.5-25 MCG/ACT Aepb Generic drug: Fluticasone-Umeclidin-Vilant Inhale 1 puff into the lungs daily. SAMPLE   triamcinolone 55 MCG/ACT Aero nasal inhaler Commonly known as: NASACORT Place 1 spray into the nose daily.   valACYclovir 1000 MG tablet Commonly known as: VALTREX Take 1,000 mg by mouth 3 (three) times daily.        LABORATORY STUDIES CBC    Component Value Date/Time   WBC 7.3 09/02/2021 1940   RBC 3.61 (L) 09/02/2021 1940   HGB 12.5 (L) 09/02/2021 1940   HCT 40.7 09/02/2021 1940   PLT 177 09/02/2021 1940   MCV 112.7 (H) 09/02/2021 1940   MCH 34.6 (H)  09/02/2021 1940   MCHC 30.7 09/02/2021 1940   RDW 14.6 09/02/2021 1940   LYMPHSABS 1.3 09/02/2021 1940   MONOABS 0.8 09/02/2021 1940   EOSABS 0.4 09/02/2021 1940   BASOSABS 0.0 09/02/2021 1940   CMP    Component Value Date/Time   NA 138 09/04/2021 0143   K 4.3 09/04/2021 0143   CL 105 09/04/2021 0143   CO2 25 09/04/2021 0143   GLUCOSE 96 09/04/2021 0143   BUN 21 09/04/2021 0143   CREATININE 1.33 (H) 09/04/2021 0143   CALCIUM 9.1 09/04/2021 0143   PROT 7.2 09/02/2021 1940   ALBUMIN 4.0 09/02/2021 1940   AST 24 09/02/2021 1940   ALT 14 09/02/2021 1940   ALKPHOS 52  09/02/2021 1940   BILITOT 0.6 09/02/2021 1940   GFRNONAA 56 (L) 09/04/2021 0143   GFRAA  03/31/2009 1100    >60        The eGFR has been calculated using the MDRD equation. This calculation has not been validated in all clinical situations. eGFR's persistently <60 mL/min signify possible Chronic Kidney Disease.   COAGS Lab Results  Component Value Date   INR 1.0 09/02/2021   INR 1.07 03/24/2009   INR 1.1 06/22/2007   Lipid Panel    Component Value Date/Time   CHOL 94 09/04/2021 0143   TRIG 52 09/04/2021 0143   HDL 37 (L) 09/04/2021 0143   CHOLHDL 2.5 09/04/2021 0143   VLDL 10 09/04/2021 0143   LDLCALC 47 09/04/2021 0143   HgbA1C  Lab Results  Component Value Date   HGBA1C 4.5 (L) 09/02/2021   Urinalysis    Component Value Date/Time   COLORURINE YELLOW 11/26/2020 1457   APPEARANCEUR CLEAR 11/26/2020 1457   LABSPEC 1.037 (H) 11/26/2020 1457   PHURINE 5.0 11/26/2020 1457   GLUCOSEU >=500 (A) 11/26/2020 1457   HGBUR NEGATIVE 11/26/2020 1457   BILIRUBINUR NEGATIVE 11/26/2020 1457   KETONESUR 20 (A) 11/26/2020 1457   PROTEINUR 30 (A) 11/26/2020 1457   NITRITE NEGATIVE 11/26/2020 1457   LEUKOCYTESUR NEGATIVE 11/26/2020 1457   Urine Drug Screen     Component Value Date/Time   LABOPIA NONE DETECTED 09/04/2021 0821   COCAINSCRNUR NONE DETECTED 09/04/2021 0821   LABBENZ NONE DETECTED 09/04/2021 0821   AMPHETMU NONE DETECTED 09/04/2021 0821   THCU NONE DETECTED 09/04/2021 0821   LABBARB NONE DETECTED 09/04/2021 0821    Alcohol Level No results found for: "ETH"   SIGNIFICANT DIAGNOSTIC STUDIES ECHOCARDIOGRAM COMPLETE  Result Date: 09/03/2021    ECHOCARDIOGRAM REPORT   Patient Name:   [name] [name] Date of Exam: 09/03/2021 Medical Rec #:  [id number]       Height:       66.0 in Accession #:    [id number]      Weight:       179.2 lb Date of Birth:  10/01/1945       BSA:          1.909 m Patient Age:    76 years        BP:           147/75 mmHg Patient Gender: M                HR:           77 bpm. Exam Location:  Inpatient Procedure: 2D Echo, 3D Echo, Cardiac Doppler and Color Doppler Indications:    Stroke I63.9  History:        Patient has prior history of Echocardiogram  examinations, most                 recent 12/01/2020. PAD; Risk Factors:Hypertension and Former                 Smoker. TEVAR unknown date.                 Aortic Valve: Redo sternotomy avr with 58m medtronic mosaid                 porcine valve and ascending aortic aneurysm repair N/A.                 Mediansternotomy for ascending aortic and hemi-arch repair and                 aortic valve resuspension N/A 06/10/2007 valve is present in the                 aortic position. Procedure Date: 02/10/2011.  Sonographer:    TDarlina SicilianRDCS Referring Phys: [id number] 1. Left ventricular ejection fraction, by estimation, is 60 to 65%. The left ventricle has normal function. The left ventricle has no regional wall motion abnormalities. There is mild left ventricular hypertrophy. Left ventricular diastolic parameters are consistent with Grade I diastolic dysfunction (impaired relaxation).  2. Right ventricular systolic function is normal. The right ventricular size is normal. Tricuspid regurgitation signal is inadequate for assessing PA pressure.  3. 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 Mean gradient 10 mmHg, no significant stenosis. No significant regurgitation noted.  4. Ascending aorta has been repaired/replaced. There is mild dilatation of the aortic root, measuring 40 mm.  5. The mitral valve is normal in structure. No evidence of mitral valve regurgitation. No evidence of mitral stenosis.  6. The inferior vena cava is normal in size with greater than 50% respiratory variability, suggesting right atrial pressure of 3 mmHg. FINDINGS  Left Ventricle: Left ventricular ejection fraction, by estimation, is 60 to 65%. The left ventricle has normal function. The  left ventricle has no regional wall motion abnormalities. The left ventricular internal cavity size was normal in size. There is  mild left ventricular hypertrophy. Left ventricular diastolic parameters are consistent with Grade I diastolic dysfunction (impaired relaxation). Right Ventricle: The right ventricular size is normal. No increase in right ventricular wall thickness. Right ventricular systolic function is normal. Tricuspid regurgitation signal is inadequate for assessing PA pressure. Left Atrium: Left atrial size was normal in size. Right Atrium: Right atrial size was normal in size. Pericardium: There is no evidence of pericardial effusion. Mitral Valve: The mitral valve is normal in structure. No evidence of mitral valve regurgitation. No evidence of mitral valve stenosis. Tricuspid Valve: The tricuspid valve is normal in structure. Tricuspid valve regurgitation is not demonstrated. 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 Mean gradient 10 mmHg, no significant stenosis. No significant regurgitation noted. The aortic valve has been repaired/replaced. Aortic valve regurgitation is not visualized. Aortic valve mean gradient measures 10.0 mmHg. Aortic valve peak gradient measures 19.8 mmHg. Aortic valve area, by VTI measures 3.03 cm. There is a Redo sternotomy avr with 279mmedtronic mosaid porcine valve and ascending aortic aneurysm repair N/A. Mediansternotomy for ascending aortic and hemi-arch repair and aortic valve resuspension N/A 06/10/2007 valve present in the aortic position. Procedure Date: 02/10/2011. Pulmonic Valve: The pulmonic valve was normal in structure. Pulmonic valve regurgitation is trivial. Aorta: Aortic dilatation noted and the aortic root/ascending aorta has been  repaired/replaced. There is mild dilatation of the aortic root, measuring 40 mm. Venous: The inferior vena cava is normal in size with greater than 50% respiratory variability, suggesting  right atrial pressure of 3 mmHg. IAS/Shunts: No atrial level shunt detected by color flow Doppler.  LEFT VENTRICLE PLAX 2D LVIDd:         3.50 cm   Diastology LVIDs:         3.00 cm   LV e' medial:    3.30 cm/s LV PW:         1.20 cm   LV E/e' medial:  20.8 LV IVS:        1.60 cm   LV e' lateral:   6.48 cm/s LVOT diam:     2.30 cm   LV E/e' lateral: 10.6 LV SV:         133 LV SV Index:   70 LVOT Area:     4.15 cm                           3D Volume EF:                          3D EF:        60 %                          LV EDV:       185 ml                          LV ESV:       74 ml                          LV SV:        111 ml RIGHT VENTRICLE RV S prime:     10.60 cm/s TAPSE (M-mode): 1.3 cm LEFT ATRIUM             Index        RIGHT ATRIUM           Index LA Vol (A2C):   34.3 ml 17.97 ml/m  RA Area:     14.60 cm LA Vol (A4C):   42.1 ml 22.06 ml/m  RA Volume:   29.80 ml  15.61 ml/m LA Biplane Vol: 38.2 ml 20.01 ml/m  AORTIC VALVE AV Area (Vmax):    3.21 cm AV Area (Vmean):   3.34 cm AV Area (VTI):     3.03 cm AV Vmax:           222.50 cm/s AV Vmean:          145.500 cm/s AV VTI:            0.440 m AV Peak Grad:      19.8 mmHg AV Mean Grad:      10.0 mmHg LVOT Vmax:         172.00 cm/s LVOT Vmean:        117.000 cm/s LVOT VTI:          0.321 m LVOT/AV VTI ratio: 0.73  AORTA Ao Root diam: 4.00 cm MITRAL VALVE MV Area (PHT): 3.11 cm    SHUNTS MV Decel Time: 244 msec    Systemic VTI:  0.32 m MV E velocity: 68.60 cm/s  Systemic Diam: 2.30 cm MV A  velocity: 96.80 cm/s MV E/A ratio:  0.71 Dalton McleanMD Electronically signed by Franki Monte Signature Date/Time: 09/03/2021/2:33:26 PM    Final    CT ANGIO HEAD NECK W WO CM  Result Date: 09/03/2021 CLINICAL DATA:  Hemorrhage follow-up EXAM: CT ANGIOGRAPHY HEAD AND NECK TECHNIQUE: Multidetector CT imaging of the head and neck was performed using the standard protocol during bolus administration of intravenous contrast. Multiplanar CT image reconstructions and  MIPs were obtained to evaluate the vascular anatomy. Carotid stenosis measurements (when applicable) are obtained utilizing NASCET criteria, using the distal internal carotid diameter as the denominator. RADIATION DOSE REDUCTION: This exam was performed according to the departmental dose-optimization program which includes automated exposure control, adjustment of the mA and/or kV according to patient size and/or use of iterative reconstruction technique. CONTRAST:  45m OMNIPAQUE IOHEXOL 350 MG/ML SOLN COMPARISON:  None Available. FINDINGS: CT HEAD FINDINGS Brain: Unchanged size of small focus of intraparenchymal hemorrhage at the posterior aspect of the right lentiform nucleus. There is generalized atrophy without lobar predilection. Multiple old cerebellar infarcts. There is hypoattenuation of the periventricular white matter, most commonly indicating chronic ischemic microangiopathy. Skull: The visualized skull base, calvarium and extracranial soft tissues are normal. Sinuses/Orbits: No fluid levels or advanced mucosal thickening of the visualized paranasal sinuses. No mastoid or middle ear effusion. The orbits are normal. CTA NECK FINDINGS SKELETON: There is no bony spinal canal stenosis. No lytic or blastic lesion. OTHER NECK: Normal pharynx, larynx and major salivary glands. No cervical lymphadenopathy. Unremarkable thyroid gland. UPPER CHEST: No pneumothorax or pleural effusion. No nodules or masses. AORTIC ARCH: There is an endovascular thoracic aortic repair. 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 RIGHT CAROTID SYSTEM: No dissection, occlusion or aneurysm. Mild atherosclerotic calcification at the carotid bifurcation without hemodynamically significant stenosis. LEFT CAROTID SYSTEM: No dissection, occlusion or aneurysm. Mild atherosclerotic calcification at the carotid bifurcation without hemodynamically significant stenosis. VERTEBRAL ARTERIES: Right  dominant configuration. There is no opacification of the left V1 segment or proximal V2 segment. The distal left V2 and V3 segments are normal. This is likely due to altered flow in the setting of left subclavian bypass. The right vertebral artery is normal. CTA HEAD FINDINGS POSTERIOR CIRCULATION: --Vertebral arteries: Normal V4 segments. --Inferior cerebellar arteries: Normal. --Basilar artery: Normal. --Superior cerebellar arteries: Normal. --Posterior cerebral arteries (PCA): Normal. ANTERIOR CIRCULATION: --Intracranial internal carotid arteries: Normal. --Anterior cerebral arteries (ACA): Normal. Both A1 segments are present. Patent anterior communicating artery (a-comm). --Middle cerebral arteries (MCA): Normal. VENOUS SINUSES: As permitted by contrast timing, patent. ANATOMIC VARIANTS: None Review of the MIP images confirms the above findings. IMPRESSION: 1. Unchanged size of small focus of intraparenchymal hemorrhage at the posterior aspect of the right lentiform nucleus. 2. No intracranial arterial occlusion or high-grade stenosis. No intracranial vascular lesion. 3. 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 Electronically Signed   By: KUlyses JarredM.D.   On: 09/03/2021 02:32   MR BRAIN W WO CONTRAST  Result Date: 09/03/2021 CLINICAL DATA:  Acute neurologic deficit EXAM: MRI HEAD WITHOUT AND WITH CONTRAST TECHNIQUE: Multiplanar, multiecho pulse sequences of the brain and surrounding structures were obtained without and with intravenous contrast. CONTRAST:  7.535mGADAVIST GADOBUTROL 1 MMOL/ML IV SOLN COMPARISON:  None Available. FINDINGS: Brain: No acute infarct, mass effect or extra-axial collection. Multiple chronic microhemorrhages in a mixed central and peripheral pattern. There is multifocal hyperintense T2-weighted signal within the white matter.  There is advanced atrophy. There are old right cerebellar and basal ganglia small vessel  infarcts. The midline structures are normal. There is no abnormal contrast enhancement. Vascular: Major flow voids are preserved. Skull and upper cervical spine: Normal calvarium and skull base. Visualized upper cervical spine and soft tissues are normal. Sinuses/Orbits:No paranasal sinus fluid levels or advanced mucosal thickening. No mastoid or middle ear effusion. Normal orbits. IMPRESSION: 1. No acute intracranial abnormality. 2. 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 3. Multiple chronic microhemorrhages in a mixed central and peripheral pattern, which may be due to chronic hypertensive angiopathy or cerebral amyloid angiopathy. Electronically Signed   By: Ulyses Jarred M.D.   On: 09/03/2021 02:17   CT HEAD WO CONTRAST (5MM)  Result Date: 09/02/2021 CLINICAL DATA:  Injury of head, initial encounter. Additional history provided: Fall in July, history of CAD, hypertension, diabetes mellitus. EXAM: CT HEAD WITHOUT CONTRAST TECHNIQUE: Contiguous axial images were obtained from the base of the skull through the vertex without intravenous contrast. RADIATION DOSE REDUCTION: This exam was performed according to the departmental dose-optimization program which includes automated exposure control, adjustment of the mA and/or kV according to patient size and/or use of iterative reconstruction technique. COMPARISON:  CT 06/22/2007. FINDINGS: Brain: Moderate cerebral atrophy.  Comparatively mild cerebellar atrophy. 10 mm acute parenchymal hemorrhage at the junction of the right lentiform nucleus and retrolenticular white matter (series 2, image 16) (series 6, image 43). Moderate patchy and ill-defined hypoattenuation within the cerebral white matter, nonspecific but compatible chronic small vessel disease. 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 No demarcated  cortical infarct. No extra-axial fluid collection. No evidence of an intracranial mass. No midline shift. Vascular: No hyperdense vessel. Skull: No fracture or aggressive osseous lesion. Sinuses/Orbits: No mass or acute finding within the imaged orbits. Mild mucosal thickening within the left frontal and bilateral ethmoid sinuses. Impression #1 called by telephone at the time of interpretation on 09/02/2021 at 12:11 pm to provider [facility] , who verbally acknowledged these results. IMPRESSION: 10 mm acute parenchymal hemorrhage at the junction of the right lentiform nucleus and retrolenticular white matter. Moderate chronic small vessel ischemic changes within the cerebral white matter. Chronic infarcts within bilateral cerebellar hemispheres, some of which are new from the prior head CT of 06/22/2007. Moderate cerebral atrophy. Comparatively mild cerebellar atrophy. Electronically Signed   By: Kellie Simmering D.O.   On: 09/02/2021 12:14      HISTORY OF PRESENT ILLNESS Patient with a history of dissection of the ascending aorta, L5 fracture,DDD, HTN and dementia presented after a screening outpatient head CT showed a small IPH at the junction of the right lentiform nucleus and retrolenticular white matter.  HOSPITAL COURSE Patient was admitted for observation.  His IPH was asymptomatic, and his blood pressure remained under control.  He is ready to be discharged with outpatient PT.  ICH score was 0  ICH:  small right sided subcortical ICH in setting of possible CAA versus hypertensive small vessel disease CT head 81m acute IPH at junction of right lentiform nucleus and retrolenticular white matter, chronic small vessel ischemia and moderate atrophy CTA head & neck unchanged IPH at posterior aspect of right lentiform nucleus, no LVO or high grade stenosis MRI  Atrophy and chronic microvascular disease with old infarcts in right cerebellum and right basal ganglia, multiple chronic micorhemorrhages 2D Echo  EF  40-98%, grade 1 diastolic dysfunction, bioprosthetic aortic valve, no atrial level shunt LDL 47 HgbA1c 4.5 VTE prophylaxis - SCDs  aspirin 81 mg daily prior to admission, now on No antithrombotic secondary to IPH. [facility] with Dr. Leonie Man to decide on further antithrombotic regimen given questionable CAA. Therapy recommendations:  outpatient PT   Hypertension Home meds:  metoprolol XL 100 mg daily Stable Keep SBP <160 Long-term BP goal normotensive   Hyperlipidemia Home meds: Crestor 20 LDL 47, goal < 70 Continue Crestor 20 Continue statin at discharge   Diabetes type II Controlled Home meds:  metformin 1000 mg BID HgbA1c 4.5, goal < 7.0 CBGs SSI Close PCP follow-up   Other Stroke Risk Factors Advanced Age >/= 26  Former Cigarette smoker Hx stroke on imaging   Other Active Problems Dementia History of aortic dissection   DISCHARGE EXAM Blood pressure 137/63, pulse 84, temperature 99.3 F (37.4 C), temperature source Oral, resp. rate 16, SpO2 94 %.  PHYSICAL EXAM General:  Alert, well-nourished, well-developed patient in no acute distress Respiratory:  Regular, unlabored respirations on room air.   NEURO:  Mental Status: AA&Ox3 Speech/Language: speech is without dysarthria or aphasia.  Fluency, and comprehension intact.    Cranial Nerves:  II: PERRL.  III, IV, VI: EOMI. Eyelids elevate symmetrically.  V: Sensation is intact to light touch and symmetrical to face.  VII: Smile is symmetrical.   VIII: hearing intact to voice. IX, X: Phonation is normal.  XII: tongue is midline without fasciculations. Motor: 5/5 strength to all muscle groups tested.  Tone: is normal and bulk is normal Sensation- Intact to light touch bilaterally.  Coordination: FTN intact bilaterally.No drift.  Gait- deferred  Discharge Diet       Diet   Diet regular Room service appropriate? Yes with Assist; Fluid consistency: Thin   liquids  DISCHARGE PLAN Disposition:  home  with family No antithrombotic for secondary stroke prevention secondary to IPH. Ongoing stroke risk factor control by Primary Care Physician at time of discharge Follow-up PCP [name] Sheriff, MD in 2 weeks. Follow-up in [facility] in 8 weeks, office to schedule an appointment.   35 minutes were spent preparing discharge.  [place] , MSN, AGACNP-BC [facility] See Amion for schedule and pager information 09/04/2021 12:48 PM  ATTENDING NOTE: I reviewed above note and agree with the assessment and plan. Pt was seen and examined.   76 year old male with history of hypertension, diabetes, dementia, aortic dissection, recent fall on 7/15 with left-sided pain admitted for outpatient CT small right BG ICH.  CT head and neck unremarkable, stable ICH.  MRI showed old right cerebellum and BG infarct, mild micro hemorrhage, CAA versus hypertensive.  EF 60 to 65%, LDL 47, A1C 4.5, UDS negative.  Creatinine 1.52-1.33.  On exam, patient sitting in chair, awake alert, orientated to place time and age.  No aphasia, able to name repeat, follows some commands.  Moving all extremities symmetrically, no significant neuro finding.  However, has mild tachycardia 100-110s.  Etiology for small ICH likely hypertensive given location.  However, MRI showed mild multifocal microhemorrhages, concerning for CAA versus hypertensive.  We will hold off home aspirin for now given ICH.  He will follow-up with Dr. Leonie Man as outpatient to decide further antithrombotic regimen.  Continue Crestor 20.  Resume home BP meds.  PT/OT recommend outpatient PT.  Once mild tachycardia resolved, he will be discharged with outpatient follow-up.  For detailed  assessment and plan, please refer to above/below as I have made changes wherever appropriate.   Rosalin Hawking, MD PhD Stroke Neurology 09/04/2021 5:23 PM

## 2021-09-04 NOTE — Progress Notes (Signed)
Physical Therapy Treatment Patient Details Name: [name] [name] MRN: [id number] DOB: [birth year] Today's Date: 09/04/2021   History of Present Illness The pt is a 76 yo male presenting 8/3 after outpatient CT showed 10 mm acute intraparenchymal hemorrhage at the junction of the right lentiform nucleus and the retrolenticular white matter. PMH includes: ascending aortic dissection, chronic back pain and L5 fracture, degenerative disc disease, hypertension, hard of hearing and underlying dementia.    PT Comments    Attempted to focus session on progressing pt's dynamic standing balance and safety with ambulation. However, pt disoriented, impulsive, and difficult to redirect today. Pt only ambulated to pick up shoes off ground and to sit in recliner, repeatedly stating "let's go", appearing to be anxious. Attempted to comfort pt and re-orient, but pt not receptive. Pt also potentially hallucinating, talking to "[name] [name]" who he reported was in the room, even though no one else was there. Notified RN. Pt remains at risk for falls, displaying LOB bouts needing up to minA to recover. Current recommendations remain appropriate provided family can provide the level of care needed to assist the pt. Will continue to follow acutely.     Recommendations for follow up therapy are one component of a multi-disciplinary discharge planning process, led by the attending physician.  Recommendations may be updated based on patient status, additional functional criteria and insurance authorization.  Follow Up Recommendations  Outpatient PT (neuro)     Assistance Recommended at Discharge Frequent or constant Supervision/Assistance  Patient can return home with the following A little help with walking and/or transfers;A little help with bathing/dressing/bathroom;Direct supervision/assist for medications management;Direct supervision/assist for financial management;Assist for transportation;Help with stairs or ramp for  entrance;Assistance with cooking/housework   Equipment Recommendations  None recommended by PT (pt has needed DME)    Recommendations for Other Services       Precautions / Restrictions Precautions Precautions: Fall Precaution Comments: fall on july 15 with head injury Restrictions Weight Bearing Restrictions: No     Mobility  Bed Mobility Overal bed mobility: Needs Assistance Bed Mobility: Supine to Sit     Supine to sit: Min guard     General bed mobility comments: increased time and effort to raise trunk from bed    Transfers Overall transfer level: Needs assistance Equipment used: Rollator (4 wheels) Transfers: Sit to/from Stand Sit to Stand: Min guard           General transfer comment: minG with increased time to rise, with pt pushing up with one hand on bed. Pt with posterior bias, leaning posterior aspect of legs on bed surface with transfer and noted toes off ground.    Ambulation/Gait Ambulation/Gait assistance: Min guard, Min assist Gait Distance (Feet): 10 Feet Assistive device: Rollator (4 wheels) Gait Pattern/deviations: Step-through pattern, Drifts right/left, Shuffle, Decreased stride length, Trunk flexed Gait velocity: decreased Gait velocity interpretation: <1.31 ft/sec, indicative of household ambulator   General Gait Details: Pt with small, shuffling, unsteady steps in room. Unable to direct pt to progress further past him impulsively walking to his shoes, bending down and grabbing them, and then sitting in the recliner. Pt with slight lateral LOB, needing up to minA to prevent LOB   Stairs             Wheelchair Mobility    Modified Rankin (Stroke Patients Only) Modified Rankin (Stroke Patients Only) Pre-Morbid Rankin Score: Moderate disability Modified Rankin: Moderately severe disability     Balance Overall balance assessment: History of Falls, Needs assistance  Sitting-balance support: No upper extremity supported, Feet  supported Sitting balance-Leahy Scale: Fair     Standing balance support: Bilateral upper extremity supported, No upper extremity supported, During functional activity Standing balance-Leahy Scale: Fair Standing balance comment: pt with recent fall, displaying lateral LOB needing minA to recover at times. Pt reaching off BOS to grab shoes from ground without UE support, min guard for safety.                            Cognition Arousal/Alertness: Awake/alert Behavior During Therapy: Impulsive, Anxious Overall Cognitive Status: History of cognitive impairments - at baseline                                 General Comments: pt with hx of dementia. Pt stating he thinks he is at home and continually stating "let's go" and picking up his shoes to try to leave. However, when asked/cued to ambulate, pt declined and would only sit in chair, but still stated "let's go". Pt then looking to his L and stating "hey [name] [name]" repeatedly and reported "[name] [name]" was in the room when in reality no one else was there. Unable to redirect pt to perform tasks today, thus limited to bed > chair transfer with short gait bout in room to retrieve his shoes impulsively. Pt appeared anxious, thus did not try to continue to progress pt.        Exercises      General Comments General comments (skin integrity, edema, etc.): VSS on RA      Pertinent Vitals/Pain Pain Assessment Pain Assessment: Faces Faces Pain Scale: No hurt Pain Intervention(s): Monitored during session    Home Living                          Prior Function            PT Goals (current goals can now be found in the care plan section) Acute Rehab PT Goals Patient Stated Goal: to leave PT Goal Formulation: With patient Time For Goal Achievement: 09/17/21 Potential to Achieve Goals: Good Progress towards PT goals: Progressing toward goals    Frequency    Min 3X/week      PT Plan Current plan remains  appropriate    Co-evaluation              AM-PAC PT "6 Clicks" Mobility   Outcome Measure  Help needed turning from your back to your side while in a flat bed without using bedrails?: A Little Help needed moving from lying on your back to sitting on the side of a flat bed without using bedrails?: A Little Help needed moving to and from a bed to a chair (including a wheelchair)?: A Little Help needed standing up from a chair using your arms (e.g., wheelchair or bedside chair)?: A Little Help needed to walk in hospital room?: A Little Help needed climbing 3-5 steps with a railing? : A Little 6 Click Score: 18    End of Session Equipment Utilized During Treatment: Gait belt Activity Tolerance: Patient tolerated treatment well;Other (comment) (limited by confusion) Patient left: in chair;with call bell/phone within reach;with chair alarm set Nurse Communication: Mobility status;Other (comment) (possible hallucinations, pt anxious and confused) PT Visit Diagnosis: Other abnormalities of gait and mobility (R26.89);Muscle weakness (generalized) (M62.81);History of falling (Z91.81);Unsteadiness on feet (R26.81);Difficulty in walking, not  elsewhere classified (R26.2)     Time: 3295-1884 PT Time Calculation (min) (ACUTE ONLY): 18 min  Charges:  $Therapeutic Activity: 8-22 mins                     Raymond Gurney, PT, DPT Acute Rehabilitation Services  Office: [phone]    Jewel Baize 09/04/2021, 9:45 AM

## 2021-09-04 NOTE — Plan of Care (Signed)
Pt d/c to home by car with family. Assessment stable. D/C instructions reviewed and all questions answered. 

## 2021-09-04 NOTE — Progress Notes (Signed)
Physical Therapy Treatment Patient Details Name: [name] [name] MRN: [id number] DOB: April 26, 1945 Today's Date: 09/04/2021   History of Present Illness The pt is a 76 yo male presenting 8/3 after outpatient CT showed 10 mm acute intraparenchymal hemorrhage at the junction of the right lentiform nucleus and the retrolenticular white matter. PMH includes: ascending aortic dissection, chronic back pain and L5 fracture, degenerative disc disease, hypertension, hard of hearing and underlying dementia.    PT Comments    The pt was seen for continued progress of OOB mobility and balance challenge. He was ambulating with RW in the room upon arrival of PT and was able to complete hallway ambulation without UE support and minG for safety. He tolerated dynamic balance challenges without overt LOB, but did have slowed speed and shortened strides. The pt will continue to benefit from skilled PT to progress functional strength, dynamic stability, and balance reactions to reduce risk of falls and fall-related injury. Recommendations remain appropriate.    Recommendations for follow up therapy are one component of a multi-disciplinary discharge planning process, led by the attending physician.  Recommendations may be updated based on patient status, additional functional criteria and insurance authorization.  Follow Up Recommendations  Outpatient PT (OP neuro)     Assistance Recommended at Discharge Intermittent Supervision/Assistance  Patient can return home with the following A little help with walking and/or transfers;A little help with bathing/dressing/bathroom;Direct supervision/assist for medications management;Direct supervision/assist for financial management;Assist for transportation;Help with stairs or ramp for entrance   Equipment Recommendations  None recommended by PT (pt has needed DME)    Recommendations for Other Services       Precautions / Restrictions Precautions Precautions:  Fall Precaution Comments: fall on july 15 with head injury Restrictions Weight Bearing Restrictions: No     Mobility  Bed Mobility               General bed mobility comments: pt OOB in bathroom at start and end of session    Transfers Overall transfer level: Needs assistance Equipment used: Rolling walker (2 wheels), None Transfers: Sit to/from Stand Sit to Stand: Min guard           General transfer comment: minG with increased time to rise, uses at least single UE to lower to chair and low toilet    Ambulation/Gait Ambulation/Gait assistance: Min guard Gait Distance (Feet): 150 Feet Assistive device: Rolling walker (2 wheels), None, 1 person hand held assist Gait Pattern/deviations: Step-through pattern, Drifts right/left Gait velocity: decreased Gait velocity interpretation: <1.31 ft/sec, indicative of household ambulator   General Gait Details: pt with small shuffling steps, able to complete some balance challenges without LOB but slowed speed. fatigues after ~100 ft reaching for hallway rail       Modified Rankin (Stroke Patients Only) Modified Rankin (Stroke Patients Only) Pre-Morbid Rankin Score: Moderate disability Modified Rankin: Moderately severe disability     Balance Overall balance assessment: History of Falls, Needs assistance Sitting-balance support: No upper extremity supported, Feet supported Sitting balance-Leahy Scale: Fair Sitting balance - Comments: able to lean outside of BOS   Standing balance support: Bilateral upper extremity supported, No upper extremity supported, During functional activity Standing balance-Leahy Scale: Fair Standing balance comment: pt with recent fall, drifting to R and mildly unsteady. able to ambulate short distance without UE support                            Cognition Arousal/Alertness: Awake/alert  Behavior During Therapy: WFL for tasks assessed/performed Overall Cognitive Status: History  of cognitive impairments - at baseline                                 General Comments: pt with hx of dementia, needing increased cues and instructions, often visual cues in session. no instances of impulsivity at this time        Exercises Other Exercises Other Exercises: repeated sit-stand from recliner x10    General Comments General comments (skin integrity, edema, etc.): VSS on RA      Pertinent Vitals/Pain Pain Assessment Pain Assessment: Faces Faces Pain Scale: No hurt Pain Intervention(s): Monitored during session     PT Goals (current goals can now be found in the care plan section) Acute Rehab PT Goals Patient Stated Goal: return home, keep his strength and continue exercises PT Goal Formulation: With patient Time For Goal Achievement: 09/17/21 Potential to Achieve Goals: Good Progress towards PT goals: Progressing toward goals    Frequency    Min 3X/week      PT Plan Current plan remains appropriate       AM-PAC PT "6 Clicks" Mobility   Outcome Measure  Help needed turning from your back to your side while in a flat bed without using bedrails?: A Little Help needed moving from lying on your back to sitting on the side of a flat bed without using bedrails?: A Little Help needed moving to and from a bed to a chair (including a wheelchair)?: A Little Help needed standing up from a chair using your arms (e.g., wheelchair or bedside chair)?: A Little Help needed to walk in hospital room?: A Little Help needed climbing 3-5 steps with a railing? : A Little 6 Click Score: 18    End of Session Equipment Utilized During Treatment: Gait belt Activity Tolerance: Patient tolerated treatment well Patient left: in chair;with call bell/phone within reach;with chair alarm set Nurse Communication: Mobility status PT Visit Diagnosis: Other abnormalities of gait and mobility (R26.89);Muscle weakness (generalized) (M62.81);History of falling (Z91.81)      Time: 3762-8315 PT Time Calculation (min) (ACUTE ONLY): 14 min  Charges:  $Gait Training: 8-22 mins                     Vickki Muff, PT, DPT   Acute Rehabilitation Department   Ronnie Derby 09/04/2021, 5:34 PM

## 2021-09-04 NOTE — TOC Transition Note (Signed)
Transition of Care [facility]) - CM/SW Discharge Note   Patient Details  Name: [name] [name] MRN: [id number] Date of Birth: 05-25-1945  Transition of Care [facility]) CM/SW Contact:  Bess Kinds, RN Phone Number: [phone] 09/04/2021, 2:09 PM   Clinical Narrative:     Patient to transition home today. No additional TOC needs identified at this time.   Final next level of care: OP Rehab Barriers to Discharge: No Barriers Identified   Patient Goals and CMS Choice        Discharge Placement                       Discharge Plan and Services   Discharge Planning Services: CM Consult                                 Social Determinants of Health (SDOH) Interventions     Readmission Risk Interventions     No data to display

## 2021-09-04 NOTE — Discharge Instructions (Addendum)
Mr. Goza, you were admitted with a small hemorrhagic stroke seen on an outpatient head CT.  You had no symptoms from this hemorrhage, and it is stable.  Your blood pressure is stable.  You will need to be seen in the stroke clinic in 6-8 weeks.  Please do not take aspirin or other blood thinners until that time.

## 2021-09-05 DIAGNOSIS — E1142 Type 2 diabetes mellitus with diabetic polyneuropathy: Secondary | ICD-10-CM | POA: Diagnosis not present

## 2021-09-05 DIAGNOSIS — M47817 Spondylosis without myelopathy or radiculopathy, lumbosacral region: Secondary | ICD-10-CM | POA: Diagnosis not present

## 2021-09-05 DIAGNOSIS — I2510 Atherosclerotic heart disease of native coronary artery without angina pectoris: Secondary | ICD-10-CM | POA: Diagnosis not present

## 2021-09-05 DIAGNOSIS — K219 Gastro-esophageal reflux disease without esophagitis: Secondary | ICD-10-CM | POA: Diagnosis not present

## 2021-09-05 DIAGNOSIS — G894 Chronic pain syndrome: Secondary | ICD-10-CM | POA: Diagnosis not present

## 2021-09-05 DIAGNOSIS — N183 Chronic kidney disease, stage 3 unspecified: Secondary | ICD-10-CM | POA: Diagnosis not present

## 2021-09-05 DIAGNOSIS — I129 Hypertensive chronic kidney disease with stage 1 through stage 4 chronic kidney disease, or unspecified chronic kidney disease: Secondary | ICD-10-CM | POA: Diagnosis not present

## 2021-09-05 DIAGNOSIS — E1122 Type 2 diabetes mellitus with diabetic chronic kidney disease: Secondary | ICD-10-CM | POA: Diagnosis not present

## 2021-09-05 DIAGNOSIS — Z8673 Personal history of transient ischemic attack (TIA), and cerebral infarction without residual deficits: Secondary | ICD-10-CM | POA: Diagnosis not present

## 2021-09-06 DIAGNOSIS — F3341 Major depressive disorder, recurrent, in partial remission: Secondary | ICD-10-CM | POA: Diagnosis not present

## 2021-09-06 DIAGNOSIS — F4312 Post-traumatic stress disorder, chronic: Secondary | ICD-10-CM | POA: Diagnosis not present

## 2021-09-06 LAB — HOMOCYSTEINE: Homocysteine: 16.1 umol/L (ref 0.0–19.2)

## 2021-09-10 ENCOUNTER — Other Ambulatory Visit: Payer: Self-pay

## 2021-09-10 NOTE — Patient Outreach (Signed)
[facility]) Care Management  09/10/2021  Lovel Suazo 10-Apr-1945 [id number]   EMMI-STROKE RED ON EMMI ALERT Day # 3 Date: 09/09/2021 Red Alert Reason: "Questions/problems with meds? Yes"    Incoming call received from patient's son-Jason who handles patient's medial affairs. Addressed red alert. Son report that some of patient's;s meds ere sent to mail order pharmacy and there was a delay getting meds so MD sent to local pharmacy. He reports patient was without meds for a few days but issues has been resolved. He is assisting pt with managing meds and fills med planner. HH services in place and has bene out os see pt. ST coming for initial visit today. Reviewed post discharge appts. Son states he called PCP office and was supposed to get a call back from them but never did. Advised him to call office back to follow up on appt. As well as patient was supposed to make neuro appt. Son will call both offices today to make appts.  Advised son that they would continue to get automated EMMI-Stroke post discharge calls to assess how they are doing following recent hospitalization and will receive a call from a nurse if any of their responses were abnormal. Patient voiced understanding and was appreciative of f/u call.   Plan: RN CM will close case.   [name], RN,BSN,CCM [facility] Care Management Telephonic Care Management Coordinator Direct Phone: [phone] Toll Free: [phone] Fax: [phone]

## 2021-09-10 NOTE — Patient Outreach (Signed)
[facility]) Care Management  09/10/2021  [name] [name] Sep 26, 1945 [id number]   EMMI-Stroke     Son called back to report that he called PCP office to make an appt. He was told PCP is on vacation and out of the office for two weeks. Sn did not want to se another MD. Earliest appt available was 09/21/21. Son concerned that patient missed 'timeframe" as ordered on d/c paperwork for PCP appt. Reassured son that patient has HH services coming in to monitor him and seeing other MDs during this time until PCP returns. Patient has appt with lung and heart MD within the next week. He voiced understanding.     [name], RN,BSN,CCM [facility] Care Management Telephonic Care Management Coordinator Direct Phone: [phone] Toll Free: [phone] Fax: [phone]

## 2021-09-10 NOTE — Patient Outreach (Signed)
[facility]) Care Management  09/10/2021  [name] [name] [birth year] [id number]    EMMI-STROKE RED ON EMMI ALERT Day # 3 Date: 09/09/2021 Red Alert Reason: "Questions/problems with meds? Yes"   Outreach attempt # 1.No answer after multiple rings and unable to leave message as        Plan: RN CM will make outreach attempt to patient within 4 business days.  [name], RN,BSN,CCM [facility] Care Management Telephonic Care Management Coordinator Direct Phone: [phone] Toll Free: [phone] Fax: [phone]

## 2021-09-13 ENCOUNTER — Other Ambulatory Visit: Payer: Self-pay

## 2021-09-13 NOTE — Patient Outreach (Signed)
[facility]) Care Management  09/13/2021  [name] [name] 15-Oct-1945 [id number]    EMMI-STROKE RED ON EMMI ALERT Day # 6 Date: 09/12/2021 Red Alert Reason: "Questions/problems with meds? Yes"   Outreach attempt # 1 to patient. No answer. RN CM left HIPAA compliant voicemail message along with contact info.       Plan: RN CM will make outreach attempt to patient within 4 business days.  [name], RN,BSN,CCM [facility] Care Management Telephonic Care Management Coordinator Direct Phone: [phone] Toll Free: [phone] Fax: [phone]

## 2021-09-13 NOTE — Patient Outreach (Signed)
[facility]) Care Management  09/13/2021  [name] [name] 11-03-1945 [id number]   EMMI-STROKE RED ON EMMI ALERT Day # 6 Date: 09/12/2021 Red Alert Reason: "Questions/problems with meds? Yes"   Voicemail message received form son. Return call to pt/son. Son states that they want patient to be on Trelegy and needs refill. Son states he has called [facility] pharmacy to request refill from MD office. Patient has lung MD appt in two days and will discuss with MD. Son is also waiting approval from [facility] to get [facility] to come in to assist them.    Plan: RN CM will close case.  [name], RN,BSN,CCM [facility] Care Management Telephonic Care Management Coordinator Direct Phone: [phone] Toll Free: [phone] Fax: [phone]

## 2021-09-15 DIAGNOSIS — J4540 Moderate persistent asthma, uncomplicated: Secondary | ICD-10-CM | POA: Diagnosis not present

## 2021-09-15 DIAGNOSIS — E559 Vitamin D deficiency, unspecified: Secondary | ICD-10-CM | POA: Diagnosis not present

## 2021-09-15 DIAGNOSIS — G4733 Obstructive sleep apnea (adult) (pediatric): Secondary | ICD-10-CM | POA: Diagnosis not present

## 2021-09-15 DIAGNOSIS — J301 Allergic rhinitis due to pollen: Secondary | ICD-10-CM | POA: Diagnosis not present

## 2021-09-20 ENCOUNTER — Other Ambulatory Visit: Payer: Self-pay

## 2021-09-20 NOTE — Patient Outreach (Addendum)
[facility]) Care Management  09/20/2021  [name] [name] 02/12/1945 [id number]    EMMI-STROKE RED ON EMMI ALERT Day # 13 Date: 09/19/2021 Red Alert Reason:"Went to follow-up appt? No  Problems refilling meds? Yes"   Outreach attempt # 1 to patient. Spoke with son/caregiver-Jason(bad phone connection and kept getting disconnected). He states that patient is currently working with ST at present. They are following up to see about [facility] coming out as no one has been out to see patient yet. Patient did complete lung MD appt last week. Son voices that MD did not want to authorize refill on breathing med and deferred that to PCP. He is unclear as to reason why. Patient goes for PCP appt tomorrow and son will discuss with PCP. Patient has completed post discharge automated EMMI calls.      [name], RN,BSN,CCM [facility] Care Management Telephonic Care Management Coordinator Direct Phone: [phone] Toll Free: [phone] Fax: [phone]

## 2021-09-21 DIAGNOSIS — E7849 Other hyperlipidemia: Secondary | ICD-10-CM | POA: Diagnosis not present

## 2021-09-21 DIAGNOSIS — J449 Chronic obstructive pulmonary disease, unspecified: Secondary | ICD-10-CM | POA: Diagnosis not present

## 2021-09-21 DIAGNOSIS — I10 Essential (primary) hypertension: Secondary | ICD-10-CM | POA: Diagnosis not present

## 2021-09-21 DIAGNOSIS — E1165 Type 2 diabetes mellitus with hyperglycemia: Secondary | ICD-10-CM | POA: Diagnosis not present

## 2021-09-21 DIAGNOSIS — Z8673 Personal history of transient ischemic attack (TIA), and cerebral infarction without residual deficits: Secondary | ICD-10-CM | POA: Diagnosis not present

## 2021-10-21 DIAGNOSIS — H6123 Impacted cerumen, bilateral: Secondary | ICD-10-CM | POA: Diagnosis not present

## 2021-11-04 DIAGNOSIS — E1122 Type 2 diabetes mellitus with diabetic chronic kidney disease: Secondary | ICD-10-CM | POA: Diagnosis not present

## 2021-11-04 DIAGNOSIS — F0283 Dementia in other diseases classified elsewhere, unspecified severity, with mood disturbance: Secondary | ICD-10-CM | POA: Diagnosis not present

## 2021-11-04 DIAGNOSIS — I6782 Cerebral ischemia: Secondary | ICD-10-CM | POA: Diagnosis not present

## 2021-11-04 DIAGNOSIS — F0284 Dementia in other diseases classified elsewhere, unspecified severity, with anxiety: Secondary | ICD-10-CM | POA: Diagnosis not present

## 2021-11-04 DIAGNOSIS — E039 Hypothyroidism, unspecified: Secondary | ICD-10-CM | POA: Diagnosis not present

## 2021-11-04 DIAGNOSIS — I129 Hypertensive chronic kidney disease with stage 1 through stage 4 chronic kidney disease, or unspecified chronic kidney disease: Secondary | ICD-10-CM | POA: Diagnosis not present

## 2021-11-04 DIAGNOSIS — F411 Generalized anxiety disorder: Secondary | ICD-10-CM | POA: Diagnosis not present

## 2021-11-04 DIAGNOSIS — F329 Major depressive disorder, single episode, unspecified: Secondary | ICD-10-CM | POA: Diagnosis not present

## 2021-11-04 DIAGNOSIS — J449 Chronic obstructive pulmonary disease, unspecified: Secondary | ICD-10-CM | POA: Diagnosis not present

## 2021-11-29 DIAGNOSIS — H401133 Primary open-angle glaucoma, bilateral, severe stage: Secondary | ICD-10-CM | POA: Diagnosis not present

## 2021-11-29 DIAGNOSIS — Z961 Presence of intraocular lens: Secondary | ICD-10-CM | POA: Diagnosis not present

## 2021-12-06 DIAGNOSIS — K219 Gastro-esophageal reflux disease without esophagitis: Secondary | ICD-10-CM | POA: Diagnosis not present

## 2021-12-06 DIAGNOSIS — J449 Chronic obstructive pulmonary disease, unspecified: Secondary | ICD-10-CM | POA: Diagnosis not present

## 2021-12-06 DIAGNOSIS — N1831 Chronic kidney disease, stage 3a: Secondary | ICD-10-CM | POA: Diagnosis not present

## 2021-12-06 DIAGNOSIS — E7800 Pure hypercholesterolemia, unspecified: Secondary | ICD-10-CM | POA: Diagnosis not present

## 2021-12-06 DIAGNOSIS — F329 Major depressive disorder, single episode, unspecified: Secondary | ICD-10-CM | POA: Diagnosis not present

## 2021-12-06 DIAGNOSIS — E1165 Type 2 diabetes mellitus with hyperglycemia: Secondary | ICD-10-CM | POA: Diagnosis not present

## 2021-12-06 DIAGNOSIS — I10 Essential (primary) hypertension: Secondary | ICD-10-CM | POA: Diagnosis not present

## 2021-12-06 DIAGNOSIS — E039 Hypothyroidism, unspecified: Secondary | ICD-10-CM | POA: Diagnosis not present

## 2022-01-03 DIAGNOSIS — F411 Generalized anxiety disorder: Secondary | ICD-10-CM | POA: Diagnosis not present

## 2022-01-03 DIAGNOSIS — J449 Chronic obstructive pulmonary disease, unspecified: Secondary | ICD-10-CM | POA: Diagnosis not present

## 2022-01-03 DIAGNOSIS — E1122 Type 2 diabetes mellitus with diabetic chronic kidney disease: Secondary | ICD-10-CM | POA: Diagnosis not present

## 2022-01-03 DIAGNOSIS — I129 Hypertensive chronic kidney disease with stage 1 through stage 4 chronic kidney disease, or unspecified chronic kidney disease: Secondary | ICD-10-CM | POA: Diagnosis not present

## 2022-01-03 DIAGNOSIS — I6782 Cerebral ischemia: Secondary | ICD-10-CM | POA: Diagnosis not present

## 2022-01-03 DIAGNOSIS — E039 Hypothyroidism, unspecified: Secondary | ICD-10-CM | POA: Diagnosis not present

## 2022-01-03 DIAGNOSIS — F0284 Dementia in other diseases classified elsewhere, unspecified severity, with anxiety: Secondary | ICD-10-CM | POA: Diagnosis not present

## 2022-01-03 DIAGNOSIS — F0283 Dementia in other diseases classified elsewhere, unspecified severity, with mood disturbance: Secondary | ICD-10-CM | POA: Diagnosis not present

## 2022-01-03 DIAGNOSIS — F329 Major depressive disorder, single episode, unspecified: Secondary | ICD-10-CM | POA: Diagnosis not present

## 2022-01-25 DIAGNOSIS — I2510 Atherosclerotic heart disease of native coronary artery without angina pectoris: Secondary | ICD-10-CM | POA: Diagnosis not present

## 2022-01-25 DIAGNOSIS — N183 Chronic kidney disease, stage 3 unspecified: Secondary | ICD-10-CM | POA: Diagnosis not present

## 2022-01-25 DIAGNOSIS — E1122 Type 2 diabetes mellitus with diabetic chronic kidney disease: Secondary | ICD-10-CM | POA: Diagnosis not present

## 2022-01-25 DIAGNOSIS — I129 Hypertensive chronic kidney disease with stage 1 through stage 4 chronic kidney disease, or unspecified chronic kidney disease: Secondary | ICD-10-CM | POA: Diagnosis not present

## 2022-01-25 DIAGNOSIS — E7849 Other hyperlipidemia: Secondary | ICD-10-CM | POA: Diagnosis not present

## 2022-01-25 DIAGNOSIS — Z8673 Personal history of transient ischemic attack (TIA), and cerebral infarction without residual deficits: Secondary | ICD-10-CM | POA: Diagnosis not present

## 2022-01-25 DIAGNOSIS — K219 Gastro-esophageal reflux disease without esophagitis: Secondary | ICD-10-CM | POA: Diagnosis not present

## 2022-01-25 DIAGNOSIS — E039 Hypothyroidism, unspecified: Secondary | ICD-10-CM | POA: Diagnosis not present

## 2022-01-25 DIAGNOSIS — R296 Repeated falls: Secondary | ICD-10-CM | POA: Diagnosis not present

## 2022-02-04 DIAGNOSIS — I2510 Atherosclerotic heart disease of native coronary artery without angina pectoris: Secondary | ICD-10-CM | POA: Diagnosis not present

## 2022-02-04 DIAGNOSIS — N1831 Chronic kidney disease, stage 3a: Secondary | ICD-10-CM | POA: Diagnosis not present

## 2022-02-04 DIAGNOSIS — M549 Dorsalgia, unspecified: Secondary | ICD-10-CM | POA: Diagnosis not present

## 2022-02-04 DIAGNOSIS — G4733 Obstructive sleep apnea (adult) (pediatric): Secondary | ICD-10-CM | POA: Diagnosis not present

## 2022-02-04 DIAGNOSIS — J4489 Other specified chronic obstructive pulmonary disease: Secondary | ICD-10-CM | POA: Diagnosis not present

## 2022-02-04 DIAGNOSIS — I129 Hypertensive chronic kidney disease with stage 1 through stage 4 chronic kidney disease, or unspecified chronic kidney disease: Secondary | ICD-10-CM | POA: Diagnosis not present

## 2022-02-04 DIAGNOSIS — G8929 Other chronic pain: Secondary | ICD-10-CM | POA: Diagnosis not present

## 2022-02-04 DIAGNOSIS — E1140 Type 2 diabetes mellitus with diabetic neuropathy, unspecified: Secondary | ICD-10-CM | POA: Diagnosis not present

## 2022-02-04 DIAGNOSIS — E1122 Type 2 diabetes mellitus with diabetic chronic kidney disease: Secondary | ICD-10-CM | POA: Diagnosis not present

## 2022-02-10 ENCOUNTER — Encounter: Payer: Self-pay | Admitting: Neurology

## 2022-02-10 ENCOUNTER — Ambulatory Visit: Payer: Medicare HMO | Admitting: Neurology

## 2022-02-10 VITALS — BP 168/78 | HR 60 | Ht 67.0 in

## 2022-02-10 DIAGNOSIS — I680 Cerebral amyloid angiopathy: Secondary | ICD-10-CM | POA: Diagnosis not present

## 2022-02-10 DIAGNOSIS — F0150 Vascular dementia without behavioral disturbance: Secondary | ICD-10-CM | POA: Diagnosis not present

## 2022-02-10 DIAGNOSIS — I610 Nontraumatic intracerebral hemorrhage in hemisphere, subcortical: Secondary | ICD-10-CM

## 2022-02-10 MED ORDER — DONEPEZIL HCL 10 MG PO TABS: 10.0000 mg | ORAL_TABLET | Freq: Every day | ORAL | 3 refills | Status: DC

## 2022-02-10 NOTE — Progress Notes (Signed)
[facility] [address] [place]. [place] [address] [phone]       OFFICE FOLLOW-UP NOTE  [name] Date of Birth:  [birth year] Medical Record Number:  [id number]   HPI:Mr [name] [name]  is a 77 year old hispanic male seen today for initial office follow up visit after hospital admission for ICH in August 2023. He is accompanied by his wife, son and grandson and history is obtained from them and I have personally reviewed electronic medical records and pertinent available imaging films in PACS.[name] [name] is a 77 y.o. male with PMH significant for history of ascending aortic dissection, chronic back pain and L5 fracture, degenerative disc disease, hypertension, hard of hearing and underlying dementia who had a fall on 08/14/2021.  He was seen by the PCP with no focal deficit on exam.  He had a screening outpatient CT head without contrast which demonstrated a small 10 mm acute intraparenchymal hemorrhage at the junction of the right lentiform nucleus and the retrolenticular white matter.  He takes a baby aspirin at home and took it today.  He is not on any anticoagulation.  No prior history of stroke or ICH.  He does have hypertension the family monitors his blood pressures about twice a day and notes that it ranges from 120 systolics to 140s.He does not smoke, no alcohol intake.  He was admitted to the intensive care unit and blood pressure was tightly controlled.  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  2D echo showed ejection fraction of 60 to 65% with grade 1 diastolic dysfunction.  Bioprosthetic aortic valve.  LDL cholesterol was 47 mg percent.  Hemoglobin A1c was 4.5.  His aspirin was held.  Does not have much physical deficits except for his cognitive impairment.  He was discharged home to the care of his family with home  physical occupational and speech therapy which she is still getting.  Family feels his blood pressure is still not controlled and he is cheats on his food and gets up in the middle of the night and splurges on food from the refrigerator food that he is not supposed to eat.  He is able to walk with a wheeled walker and is careful and has not had major falls.  He needs 24-hour supervision and care.  He has not been started on medications like Aricept and Namenda for his dementia.    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   Hypertension    SOB (shortness of breath) on exertion     Social History:  Social History   Socioeconomic History   Marital status: Married    Spouse name: Not on file   Number of children: Not on file   Years of education: Not on file   Highest education level: Not on file  Occupational History  Not on file  Tobacco Use   Smoking status: Former    Types: Cigarettes    Quit date: 03/24/2009    Years since quitting: 12.8   Smokeless tobacco: Never  Substance and Sexual Activity   Alcohol use: No   Drug use: Not on file   Sexual activity: Not on file  Other Topics Concern   Not on file  Social History Narrative   ** Merged History Encounter **       Social Determinants of Health   Financial Resource Strain: Not on file  Food Insecurity: Not on file  Transportation Needs: Not on file  Physical Activity: Not on file  Stress: Not on file   Social Connections: Not on file  Intimate Partner Violence: Not on file    Medications:   Current Outpatient Medications on File Prior to Visit  Medication Sig Dispense Refill   albuterol (PROVENTIL) (2.5 MG/3ML) 0.083% nebulizer solution Take 2.5 mg by nebulization every 4 (four) hours as needed for wheezing or shortness of breath.     azelastine (ASTELIN) 0.1 % nasal spray Place 1 spray into both nostrils 2 (two) times daily.     brimonidine (ALPHAGAN) 0.2 % ophthalmic solution Place 1 drop into both eyes 3 (three) times daily.     CALCIUM PO Take 1 tablet by mouth daily.     desvenlafaxine (PRISTIQ) 100 MG 24 hr tablet Take 100 mg by mouth daily.     Fluticasone-Umeclidin-Vilant (TRELEGY ELLIPTA) 100-62.5-25 MCG/ACT AEPB Inhale 1 puff into the lungs daily. SAMPLE     gabapentin (NEURONTIN) 100 MG capsule Take 100 mg by mouth 3 (three) times daily as needed (nerve pain).     ipratropium (ATROVENT) 0.06 % nasal spray Place 2 sprays into both nostrils daily.     latanoprost (XALATAN) 0.005 % ophthalmic solution Place 1 drop into both eyes at bedtime.     levocetirizine (XYZAL) 5 MG tablet Take 1 tablet (5 mg total) by mouth 2 (two) times daily as needed for allergies. 60 tablet 5   levothyroxine (SYNTHROID) 25 MCG tablet Take 25 mcg by mouth daily.     metoprolol succinate (TOPROL-XL) 100 MG 24 hr tablet Take 100 mg by mouth at bedtime.     omeprazole (PRILOSEC) 20 MG capsule Take 20 mg by mouth daily before breakfast.     POTASSIUM PO Take 1 tablet by mouth daily.     rosuvastatin (CRESTOR) 20 MG tablet Take 20 mg by mouth at bedtime.     triamcinolone (NASACORT) 55 MCG/ACT AERO nasal inhaler Place 1 spray into the nose daily. 49.5 g 1   valACYclovir (VALTREX) 1000 MG tablet Take 1,000 mg by mouth 3 (three) times daily. (Patient not taking: Reported on 02/10/2022)     No current facility-administered medications on file prior to visit.    Allergies:  No Known Allergies  Physical  Exam General: well developed, well nourished elderly hispanic male, seated, in no evident distress Head: head normocephalic and atraumatic.  Neck: supple with no carotid or supraclavicular bruits Cardiovascular: regular rate and rhythm, no murmurs Musculoskeletal: no deformity Skin:  no rash/petichiae Vascular:  Normal pulses all extremities Vitals:   02/10/22 1532  BP: (!) 168/78  Pulse: 60   Neurologic Exam Mental Status: Awake and fully alert. Oriented to place and time. Recent and remote memory poor Attention span, concentration and fund of knowledge diminished. Mood and affect appropriate. MMSE 15/30 Cranial Nerves: Fundoscopic exam reveals sharp disc margins. Pupils equal, briskly reactive  to light. Extraocular movements full without nystagmus. Visual fields full to confrontation. Hearing intact. Facial sensation intact. Face, tongue, palate moves normally and symmetrically.  Motor: Normal bulk and tone. Normal strength in all tested extremity muscles. Sensory.: intact to touch ,pinprick .position and vibratory sensation.  Coordination: Rapid alternating movements normal in all extremities. Finger-to-nose and heel-to-shin performed accurately bilaterally. Gait and Station: Arises from chair without difficulty. Stance is normal. Gait  wide based slightly ataxic while turning. Tandem gait not assessed Reflexes: 1+ and symmetric. Toes downgoing.   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    PLAN: I had a long discussion with the patient, his wife and son regarding his recent right basal ganglia hemorrhage likely related to hypertension as well as his baseline mixed dementia and answered questions.  Recommend strict control of hypertension with blood pressure goal below 140/90 and aggressive risk factor modification.  Continue ongoing physical occupational and speech therapy.  Patient encouraged to increase participation in cognitively challenging activities like solving crossword puzzles, playing bridge and sudoku.  We also discussed memory compensation strategies.  Treatment trial of Aricept daily for a month increase to 10 mg daily as tolerated.  Return for follow-up in 6 months or call earlier if necessary. Greater than 50% of time during this prolonged 40  minute visit was spent on counseling,explanation of diagnosis, planning of further management, discussion with patient and family and coordination of care Antony Contras, MD Note: This document was prepared with digital dictation and possible smart phrase technology. Any transcriptional errors that result from this process are unintentional

## 2022-02-10 NOTE — Patient Instructions (Addendum)
I had a long discussion with the patient, his wife and son regarding his recent right basal ganglia hemorrhage likely related to hypertension as well as his baseline mixed dementia and answered questions.  Recommend strict control of hypertension with blood pressure goal below 140/90 and aggressive risk factor modification.  Continue ongoing physical occupational and speech therapy.  Patient encouraged to increase participation in cognitively challenging activities like solving crossword puzzles, playing bridge and sudoku.  We also discussed memory compensation strategies.  Treatment trial of Aricept daily for a month increase to 10 mg daily as tolerated.  Return for follow-up in 6 months or call earlier if necessary.  Memory Compensation Strategies  Use "WARM" strategy.  W= write it down  A= associate it  R= repeat it  M= make a mental note  2.   You can keep a Social worker.  Use a 3-ring notebook with sections for the following: calendar, important names and phone numbers,  medications, doctors' names/phone numbers, lists/reminders, and a section to journal what you did  each day.   3.    Use a calendar to write appointments down.  4.    Write yourself a schedule for the day.  This can be placed on the calendar or in a separate section of the Memory Notebook.  Keeping a  regular schedule can help memory.  5.    Use medication organizer with sections for each day or morning/evening pills.  You may need help loading it  6.    Keep a basket, or pegboard by the door.  Place items that you need to take out with you in the basket or on the pegboard.  You may also want to  include a message board for reminders.  7.    Use sticky notes.  Place sticky notes with reminders in a place where the task is performed.  For example: " turn off the  stove" placed by the stove, "lock the door" placed on the door at eye level, " take your medications" on  the bathroom mirror or by the place where you  normally take your medications.  8.    Use alarms/timers.  Use while cooking to remind yourself to check on food or as a reminder to take your medicine, or as a  reminder to make a call, or as a reminder to perform another task, etc.

## 2022-02-12 DIAGNOSIS — I129 Hypertensive chronic kidney disease with stage 1 through stage 4 chronic kidney disease, or unspecified chronic kidney disease: Secondary | ICD-10-CM | POA: Diagnosis not present

## 2022-02-12 DIAGNOSIS — J4489 Other specified chronic obstructive pulmonary disease: Secondary | ICD-10-CM | POA: Diagnosis not present

## 2022-02-12 DIAGNOSIS — G8929 Other chronic pain: Secondary | ICD-10-CM | POA: Diagnosis not present

## 2022-02-12 DIAGNOSIS — G4733 Obstructive sleep apnea (adult) (pediatric): Secondary | ICD-10-CM | POA: Diagnosis not present

## 2022-02-12 DIAGNOSIS — N1831 Chronic kidney disease, stage 3a: Secondary | ICD-10-CM | POA: Diagnosis not present

## 2022-02-12 DIAGNOSIS — E1140 Type 2 diabetes mellitus with diabetic neuropathy, unspecified: Secondary | ICD-10-CM | POA: Diagnosis not present

## 2022-02-12 DIAGNOSIS — M549 Dorsalgia, unspecified: Secondary | ICD-10-CM | POA: Diagnosis not present

## 2022-02-12 DIAGNOSIS — E1122 Type 2 diabetes mellitus with diabetic chronic kidney disease: Secondary | ICD-10-CM | POA: Diagnosis not present

## 2022-02-12 DIAGNOSIS — I2510 Atherosclerotic heart disease of native coronary artery without angina pectoris: Secondary | ICD-10-CM | POA: Diagnosis not present

## 2022-02-23 DIAGNOSIS — G8929 Other chronic pain: Secondary | ICD-10-CM | POA: Diagnosis not present

## 2022-02-23 DIAGNOSIS — J4489 Other specified chronic obstructive pulmonary disease: Secondary | ICD-10-CM | POA: Diagnosis not present

## 2022-02-23 DIAGNOSIS — N1831 Chronic kidney disease, stage 3a: Secondary | ICD-10-CM | POA: Diagnosis not present

## 2022-02-23 DIAGNOSIS — E1140 Type 2 diabetes mellitus with diabetic neuropathy, unspecified: Secondary | ICD-10-CM | POA: Diagnosis not present

## 2022-02-23 DIAGNOSIS — M549 Dorsalgia, unspecified: Secondary | ICD-10-CM | POA: Diagnosis not present

## 2022-02-23 DIAGNOSIS — I129 Hypertensive chronic kidney disease with stage 1 through stage 4 chronic kidney disease, or unspecified chronic kidney disease: Secondary | ICD-10-CM | POA: Diagnosis not present

## 2022-02-23 DIAGNOSIS — G4733 Obstructive sleep apnea (adult) (pediatric): Secondary | ICD-10-CM | POA: Diagnosis not present

## 2022-02-23 DIAGNOSIS — E1122 Type 2 diabetes mellitus with diabetic chronic kidney disease: Secondary | ICD-10-CM | POA: Diagnosis not present

## 2022-02-23 DIAGNOSIS — I2510 Atherosclerotic heart disease of native coronary artery without angina pectoris: Secondary | ICD-10-CM | POA: Diagnosis not present

## 2022-02-24 DIAGNOSIS — R0781 Pleurodynia: Secondary | ICD-10-CM | POA: Diagnosis not present

## 2022-02-28 DIAGNOSIS — I2510 Atherosclerotic heart disease of native coronary artery without angina pectoris: Secondary | ICD-10-CM | POA: Diagnosis not present

## 2022-02-28 DIAGNOSIS — E1140 Type 2 diabetes mellitus with diabetic neuropathy, unspecified: Secondary | ICD-10-CM | POA: Diagnosis not present

## 2022-02-28 DIAGNOSIS — J4489 Other specified chronic obstructive pulmonary disease: Secondary | ICD-10-CM | POA: Diagnosis not present

## 2022-02-28 DIAGNOSIS — G4733 Obstructive sleep apnea (adult) (pediatric): Secondary | ICD-10-CM | POA: Diagnosis not present

## 2022-02-28 DIAGNOSIS — I129 Hypertensive chronic kidney disease with stage 1 through stage 4 chronic kidney disease, or unspecified chronic kidney disease: Secondary | ICD-10-CM | POA: Diagnosis not present

## 2022-02-28 DIAGNOSIS — E1122 Type 2 diabetes mellitus with diabetic chronic kidney disease: Secondary | ICD-10-CM | POA: Diagnosis not present

## 2022-02-28 DIAGNOSIS — G8929 Other chronic pain: Secondary | ICD-10-CM | POA: Diagnosis not present

## 2022-02-28 DIAGNOSIS — N1831 Chronic kidney disease, stage 3a: Secondary | ICD-10-CM | POA: Diagnosis not present

## 2022-02-28 DIAGNOSIS — M549 Dorsalgia, unspecified: Secondary | ICD-10-CM | POA: Diagnosis not present

## 2022-03-01 ENCOUNTER — Encounter: Payer: Self-pay | Admitting: Podiatry

## 2022-03-01 ENCOUNTER — Ambulatory Visit (INDEPENDENT_AMBULATORY_CARE_PROVIDER_SITE_OTHER): Payer: Medicare HMO | Admitting: Podiatry

## 2022-03-01 DIAGNOSIS — M79674 Pain in right toe(s): Secondary | ICD-10-CM | POA: Diagnosis not present

## 2022-03-01 DIAGNOSIS — M25561 Pain in right knee: Secondary | ICD-10-CM | POA: Diagnosis not present

## 2022-03-01 DIAGNOSIS — M79675 Pain in left toe(s): Secondary | ICD-10-CM | POA: Diagnosis not present

## 2022-03-01 DIAGNOSIS — B351 Tinea unguium: Secondary | ICD-10-CM

## 2022-03-01 DIAGNOSIS — M25562 Pain in left knee: Secondary | ICD-10-CM | POA: Diagnosis not present

## 2022-03-01 MED ORDER — CLOTRIMAZOLE-BETAMETHASONE 1-0.05 % EX CREA: 1.0000 | TOPICAL_CREAM | Freq: Every day | CUTANEOUS | 0 refills | Status: DC

## 2022-03-01 NOTE — Progress Notes (Signed)
He presents today after having seen Dr. Blenda Mounts in the past chief concern of painful elongated toenails and tinea pedis.  Objective: Vital signs stable alert oriented x 3.  Pulses are palpable.  Toenails are long thick yellow dystrophic onychomycotic with tinea pedis bilateral.  Assessment: Tinea pedis onychomycosis.  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

## 2022-03-03 DIAGNOSIS — I2510 Atherosclerotic heart disease of native coronary artery without angina pectoris: Secondary | ICD-10-CM | POA: Diagnosis not present

## 2022-03-03 DIAGNOSIS — E1122 Type 2 diabetes mellitus with diabetic chronic kidney disease: Secondary | ICD-10-CM | POA: Diagnosis not present

## 2022-03-03 DIAGNOSIS — J4489 Other specified chronic obstructive pulmonary disease: Secondary | ICD-10-CM | POA: Diagnosis not present

## 2022-03-03 DIAGNOSIS — E1140 Type 2 diabetes mellitus with diabetic neuropathy, unspecified: Secondary | ICD-10-CM | POA: Diagnosis not present

## 2022-03-03 DIAGNOSIS — I129 Hypertensive chronic kidney disease with stage 1 through stage 4 chronic kidney disease, or unspecified chronic kidney disease: Secondary | ICD-10-CM | POA: Diagnosis not present

## 2022-03-03 DIAGNOSIS — G8929 Other chronic pain: Secondary | ICD-10-CM | POA: Diagnosis not present

## 2022-03-03 DIAGNOSIS — M549 Dorsalgia, unspecified: Secondary | ICD-10-CM | POA: Diagnosis not present

## 2022-03-03 DIAGNOSIS — G4733 Obstructive sleep apnea (adult) (pediatric): Secondary | ICD-10-CM | POA: Diagnosis not present

## 2022-03-03 DIAGNOSIS — N1831 Chronic kidney disease, stage 3a: Secondary | ICD-10-CM | POA: Diagnosis not present

## 2022-03-06 DIAGNOSIS — J4489 Other specified chronic obstructive pulmonary disease: Secondary | ICD-10-CM | POA: Diagnosis not present

## 2022-03-06 DIAGNOSIS — I129 Hypertensive chronic kidney disease with stage 1 through stage 4 chronic kidney disease, or unspecified chronic kidney disease: Secondary | ICD-10-CM | POA: Diagnosis not present

## 2022-03-06 DIAGNOSIS — G4733 Obstructive sleep apnea (adult) (pediatric): Secondary | ICD-10-CM | POA: Diagnosis not present

## 2022-03-06 DIAGNOSIS — E1122 Type 2 diabetes mellitus with diabetic chronic kidney disease: Secondary | ICD-10-CM | POA: Diagnosis not present

## 2022-03-06 DIAGNOSIS — M549 Dorsalgia, unspecified: Secondary | ICD-10-CM | POA: Diagnosis not present

## 2022-03-06 DIAGNOSIS — E1140 Type 2 diabetes mellitus with diabetic neuropathy, unspecified: Secondary | ICD-10-CM | POA: Diagnosis not present

## 2022-03-06 DIAGNOSIS — I2510 Atherosclerotic heart disease of native coronary artery without angina pectoris: Secondary | ICD-10-CM | POA: Diagnosis not present

## 2022-03-06 DIAGNOSIS — N1831 Chronic kidney disease, stage 3a: Secondary | ICD-10-CM | POA: Diagnosis not present

## 2022-03-06 DIAGNOSIS — G8929 Other chronic pain: Secondary | ICD-10-CM | POA: Diagnosis not present

## 2022-03-07 DIAGNOSIS — G4733 Obstructive sleep apnea (adult) (pediatric): Secondary | ICD-10-CM | POA: Diagnosis not present

## 2022-03-07 DIAGNOSIS — G8929 Other chronic pain: Secondary | ICD-10-CM | POA: Diagnosis not present

## 2022-03-07 DIAGNOSIS — E1140 Type 2 diabetes mellitus with diabetic neuropathy, unspecified: Secondary | ICD-10-CM | POA: Diagnosis not present

## 2022-03-07 DIAGNOSIS — E1122 Type 2 diabetes mellitus with diabetic chronic kidney disease: Secondary | ICD-10-CM | POA: Diagnosis not present

## 2022-03-07 DIAGNOSIS — I129 Hypertensive chronic kidney disease with stage 1 through stage 4 chronic kidney disease, or unspecified chronic kidney disease: Secondary | ICD-10-CM | POA: Diagnosis not present

## 2022-03-07 DIAGNOSIS — M549 Dorsalgia, unspecified: Secondary | ICD-10-CM | POA: Diagnosis not present

## 2022-03-07 DIAGNOSIS — J4489 Other specified chronic obstructive pulmonary disease: Secondary | ICD-10-CM | POA: Diagnosis not present

## 2022-03-07 DIAGNOSIS — I2510 Atherosclerotic heart disease of native coronary artery without angina pectoris: Secondary | ICD-10-CM | POA: Diagnosis not present

## 2022-03-07 DIAGNOSIS — N1831 Chronic kidney disease, stage 3a: Secondary | ICD-10-CM | POA: Diagnosis not present

## 2022-03-14 DIAGNOSIS — J4489 Other specified chronic obstructive pulmonary disease: Secondary | ICD-10-CM | POA: Diagnosis not present

## 2022-03-14 DIAGNOSIS — I129 Hypertensive chronic kidney disease with stage 1 through stage 4 chronic kidney disease, or unspecified chronic kidney disease: Secondary | ICD-10-CM | POA: Diagnosis not present

## 2022-03-14 DIAGNOSIS — N1831 Chronic kidney disease, stage 3a: Secondary | ICD-10-CM | POA: Diagnosis not present

## 2022-03-14 DIAGNOSIS — E1122 Type 2 diabetes mellitus with diabetic chronic kidney disease: Secondary | ICD-10-CM | POA: Diagnosis not present

## 2022-03-14 DIAGNOSIS — E1140 Type 2 diabetes mellitus with diabetic neuropathy, unspecified: Secondary | ICD-10-CM | POA: Diagnosis not present

## 2022-03-14 DIAGNOSIS — M549 Dorsalgia, unspecified: Secondary | ICD-10-CM | POA: Diagnosis not present

## 2022-03-14 DIAGNOSIS — G4733 Obstructive sleep apnea (adult) (pediatric): Secondary | ICD-10-CM | POA: Diagnosis not present

## 2022-03-14 DIAGNOSIS — G8929 Other chronic pain: Secondary | ICD-10-CM | POA: Diagnosis not present

## 2022-03-14 DIAGNOSIS — I2510 Atherosclerotic heart disease of native coronary artery without angina pectoris: Secondary | ICD-10-CM | POA: Diagnosis not present

## 2022-03-16 DIAGNOSIS — J301 Allergic rhinitis due to pollen: Secondary | ICD-10-CM | POA: Diagnosis not present

## 2022-03-16 DIAGNOSIS — J4540 Moderate persistent asthma, uncomplicated: Secondary | ICD-10-CM | POA: Diagnosis not present

## 2022-03-16 DIAGNOSIS — G4733 Obstructive sleep apnea (adult) (pediatric): Secondary | ICD-10-CM | POA: Diagnosis not present

## 2022-03-16 DIAGNOSIS — E559 Vitamin D deficiency, unspecified: Secondary | ICD-10-CM | POA: Diagnosis not present

## 2022-03-18 DIAGNOSIS — N1831 Chronic kidney disease, stage 3a: Secondary | ICD-10-CM | POA: Diagnosis not present

## 2022-03-18 DIAGNOSIS — I129 Hypertensive chronic kidney disease with stage 1 through stage 4 chronic kidney disease, or unspecified chronic kidney disease: Secondary | ICD-10-CM | POA: Diagnosis not present

## 2022-03-18 DIAGNOSIS — I2510 Atherosclerotic heart disease of native coronary artery without angina pectoris: Secondary | ICD-10-CM | POA: Diagnosis not present

## 2022-03-18 DIAGNOSIS — G4733 Obstructive sleep apnea (adult) (pediatric): Secondary | ICD-10-CM | POA: Diagnosis not present

## 2022-03-18 DIAGNOSIS — E1122 Type 2 diabetes mellitus with diabetic chronic kidney disease: Secondary | ICD-10-CM | POA: Diagnosis not present

## 2022-03-18 DIAGNOSIS — G8929 Other chronic pain: Secondary | ICD-10-CM | POA: Diagnosis not present

## 2022-03-18 DIAGNOSIS — J4489 Other specified chronic obstructive pulmonary disease: Secondary | ICD-10-CM | POA: Diagnosis not present

## 2022-03-18 DIAGNOSIS — E1140 Type 2 diabetes mellitus with diabetic neuropathy, unspecified: Secondary | ICD-10-CM | POA: Diagnosis not present

## 2022-03-18 DIAGNOSIS — M549 Dorsalgia, unspecified: Secondary | ICD-10-CM | POA: Diagnosis not present

## 2022-03-21 DIAGNOSIS — G4733 Obstructive sleep apnea (adult) (pediatric): Secondary | ICD-10-CM | POA: Diagnosis not present

## 2022-03-21 DIAGNOSIS — E1140 Type 2 diabetes mellitus with diabetic neuropathy, unspecified: Secondary | ICD-10-CM | POA: Diagnosis not present

## 2022-03-21 DIAGNOSIS — N1831 Chronic kidney disease, stage 3a: Secondary | ICD-10-CM | POA: Diagnosis not present

## 2022-03-21 DIAGNOSIS — G8929 Other chronic pain: Secondary | ICD-10-CM | POA: Diagnosis not present

## 2022-03-21 DIAGNOSIS — I2510 Atherosclerotic heart disease of native coronary artery without angina pectoris: Secondary | ICD-10-CM | POA: Diagnosis not present

## 2022-03-21 DIAGNOSIS — E1122 Type 2 diabetes mellitus with diabetic chronic kidney disease: Secondary | ICD-10-CM | POA: Diagnosis not present

## 2022-03-21 DIAGNOSIS — M549 Dorsalgia, unspecified: Secondary | ICD-10-CM | POA: Diagnosis not present

## 2022-03-21 DIAGNOSIS — J4489 Other specified chronic obstructive pulmonary disease: Secondary | ICD-10-CM | POA: Diagnosis not present

## 2022-03-21 DIAGNOSIS — I129 Hypertensive chronic kidney disease with stage 1 through stage 4 chronic kidney disease, or unspecified chronic kidney disease: Secondary | ICD-10-CM | POA: Diagnosis not present

## 2022-03-25 DIAGNOSIS — N1831 Chronic kidney disease, stage 3a: Secondary | ICD-10-CM | POA: Diagnosis not present

## 2022-03-25 DIAGNOSIS — G8929 Other chronic pain: Secondary | ICD-10-CM | POA: Diagnosis not present

## 2022-03-25 DIAGNOSIS — J4489 Other specified chronic obstructive pulmonary disease: Secondary | ICD-10-CM | POA: Diagnosis not present

## 2022-03-25 DIAGNOSIS — M549 Dorsalgia, unspecified: Secondary | ICD-10-CM | POA: Diagnosis not present

## 2022-03-25 DIAGNOSIS — I129 Hypertensive chronic kidney disease with stage 1 through stage 4 chronic kidney disease, or unspecified chronic kidney disease: Secondary | ICD-10-CM | POA: Diagnosis not present

## 2022-03-25 DIAGNOSIS — E1122 Type 2 diabetes mellitus with diabetic chronic kidney disease: Secondary | ICD-10-CM | POA: Diagnosis not present

## 2022-03-25 DIAGNOSIS — E1140 Type 2 diabetes mellitus with diabetic neuropathy, unspecified: Secondary | ICD-10-CM | POA: Diagnosis not present

## 2022-03-25 DIAGNOSIS — G4733 Obstructive sleep apnea (adult) (pediatric): Secondary | ICD-10-CM | POA: Diagnosis not present

## 2022-03-25 DIAGNOSIS — I2510 Atherosclerotic heart disease of native coronary artery without angina pectoris: Secondary | ICD-10-CM | POA: Diagnosis not present

## 2022-04-01 DIAGNOSIS — R109 Unspecified abdominal pain: Secondary | ICD-10-CM | POA: Diagnosis not present

## 2022-04-01 DIAGNOSIS — G309 Alzheimer's disease, unspecified: Secondary | ICD-10-CM | POA: Diagnosis not present

## 2022-04-01 DIAGNOSIS — F0150 Vascular dementia without behavioral disturbance: Secondary | ICD-10-CM | POA: Diagnosis not present

## 2022-04-01 DIAGNOSIS — N3941 Urge incontinence: Secondary | ICD-10-CM | POA: Diagnosis not present

## 2022-04-01 DIAGNOSIS — Z9189 Other specified personal risk factors, not elsewhere classified: Secondary | ICD-10-CM | POA: Diagnosis not present

## 2022-04-01 DIAGNOSIS — F0280 Dementia in other diseases classified elsewhere without behavioral disturbance: Secondary | ICD-10-CM | POA: Diagnosis not present

## 2022-04-01 DIAGNOSIS — R296 Repeated falls: Secondary | ICD-10-CM | POA: Diagnosis not present

## 2022-04-01 DIAGNOSIS — Z87448 Personal history of other diseases of urinary system: Secondary | ICD-10-CM | POA: Diagnosis not present

## 2022-04-02 DIAGNOSIS — I2510 Atherosclerotic heart disease of native coronary artery without angina pectoris: Secondary | ICD-10-CM | POA: Diagnosis not present

## 2022-04-02 DIAGNOSIS — N1831 Chronic kidney disease, stage 3a: Secondary | ICD-10-CM | POA: Diagnosis not present

## 2022-04-02 DIAGNOSIS — G8929 Other chronic pain: Secondary | ICD-10-CM | POA: Diagnosis not present

## 2022-04-02 DIAGNOSIS — E1140 Type 2 diabetes mellitus with diabetic neuropathy, unspecified: Secondary | ICD-10-CM | POA: Diagnosis not present

## 2022-04-02 DIAGNOSIS — G4733 Obstructive sleep apnea (adult) (pediatric): Secondary | ICD-10-CM | POA: Diagnosis not present

## 2022-04-02 DIAGNOSIS — J4489 Other specified chronic obstructive pulmonary disease: Secondary | ICD-10-CM | POA: Diagnosis not present

## 2022-04-02 DIAGNOSIS — I129 Hypertensive chronic kidney disease with stage 1 through stage 4 chronic kidney disease, or unspecified chronic kidney disease: Secondary | ICD-10-CM | POA: Diagnosis not present

## 2022-04-02 DIAGNOSIS — E1122 Type 2 diabetes mellitus with diabetic chronic kidney disease: Secondary | ICD-10-CM | POA: Diagnosis not present

## 2022-04-02 DIAGNOSIS — M549 Dorsalgia, unspecified: Secondary | ICD-10-CM | POA: Diagnosis not present

## 2022-04-04 DIAGNOSIS — E1165 Type 2 diabetes mellitus with hyperglycemia: Secondary | ICD-10-CM | POA: Diagnosis not present

## 2022-04-04 DIAGNOSIS — Z961 Presence of intraocular lens: Secondary | ICD-10-CM | POA: Diagnosis not present

## 2022-04-04 DIAGNOSIS — H35033 Hypertensive retinopathy, bilateral: Secondary | ICD-10-CM | POA: Diagnosis not present

## 2022-04-04 DIAGNOSIS — H401133 Primary open-angle glaucoma, bilateral, severe stage: Secondary | ICD-10-CM | POA: Diagnosis not present

## 2022-04-05 DIAGNOSIS — M25511 Pain in right shoulder: Secondary | ICD-10-CM | POA: Diagnosis not present

## 2022-04-05 DIAGNOSIS — M533 Sacrococcygeal disorders, not elsewhere classified: Secondary | ICD-10-CM | POA: Diagnosis not present

## 2022-04-05 DIAGNOSIS — M4316 Spondylolisthesis, lumbar region: Secondary | ICD-10-CM | POA: Diagnosis not present

## 2022-04-05 DIAGNOSIS — I129 Hypertensive chronic kidney disease with stage 1 through stage 4 chronic kidney disease, or unspecified chronic kidney disease: Secondary | ICD-10-CM | POA: Diagnosis not present

## 2022-04-05 DIAGNOSIS — M19011 Primary osteoarthritis, right shoulder: Secondary | ICD-10-CM | POA: Diagnosis not present

## 2022-04-05 DIAGNOSIS — E1140 Type 2 diabetes mellitus with diabetic neuropathy, unspecified: Secondary | ICD-10-CM | POA: Diagnosis not present

## 2022-04-05 DIAGNOSIS — G8929 Other chronic pain: Secondary | ICD-10-CM | POA: Diagnosis not present

## 2022-04-05 DIAGNOSIS — J4489 Other specified chronic obstructive pulmonary disease: Secondary | ICD-10-CM | POA: Diagnosis not present

## 2022-04-05 DIAGNOSIS — M25512 Pain in left shoulder: Secondary | ICD-10-CM | POA: Diagnosis not present

## 2022-04-05 DIAGNOSIS — E1122 Type 2 diabetes mellitus with diabetic chronic kidney disease: Secondary | ICD-10-CM | POA: Diagnosis not present

## 2022-04-05 DIAGNOSIS — G4733 Obstructive sleep apnea (adult) (pediatric): Secondary | ICD-10-CM | POA: Diagnosis not present

## 2022-04-05 DIAGNOSIS — M5137 Other intervertebral disc degeneration, lumbosacral region: Secondary | ICD-10-CM | POA: Diagnosis not present

## 2022-04-05 DIAGNOSIS — M549 Dorsalgia, unspecified: Secondary | ICD-10-CM | POA: Diagnosis not present

## 2022-04-05 DIAGNOSIS — N1831 Chronic kidney disease, stage 3a: Secondary | ICD-10-CM | POA: Diagnosis not present

## 2022-04-05 DIAGNOSIS — M545 Low back pain, unspecified: Secondary | ICD-10-CM | POA: Diagnosis not present

## 2022-04-05 DIAGNOSIS — I2510 Atherosclerotic heart disease of native coronary artery without angina pectoris: Secondary | ICD-10-CM | POA: Diagnosis not present

## 2022-04-05 DIAGNOSIS — M47816 Spondylosis without myelopathy or radiculopathy, lumbar region: Secondary | ICD-10-CM | POA: Diagnosis not present

## 2022-04-05 DIAGNOSIS — M4317 Spondylolisthesis, lumbosacral region: Secondary | ICD-10-CM | POA: Diagnosis not present

## 2022-04-07 DIAGNOSIS — I2510 Atherosclerotic heart disease of native coronary artery without angina pectoris: Secondary | ICD-10-CM | POA: Diagnosis not present

## 2022-04-07 DIAGNOSIS — I700 Atherosclerosis of aorta: Secondary | ICD-10-CM | POA: Diagnosis not present

## 2022-04-07 DIAGNOSIS — M4317 Spondylolisthesis, lumbosacral region: Secondary | ICD-10-CM | POA: Diagnosis not present

## 2022-04-07 DIAGNOSIS — K8020 Calculus of gallbladder without cholecystitis without obstruction: Secondary | ICD-10-CM | POA: Diagnosis not present

## 2022-04-07 DIAGNOSIS — R109 Unspecified abdominal pain: Secondary | ICD-10-CM | POA: Diagnosis not present

## 2022-04-07 DIAGNOSIS — J432 Centrilobular emphysema: Secondary | ICD-10-CM | POA: Diagnosis not present

## 2022-04-09 DIAGNOSIS — N1831 Chronic kidney disease, stage 3a: Secondary | ICD-10-CM | POA: Diagnosis not present

## 2022-04-09 DIAGNOSIS — I129 Hypertensive chronic kidney disease with stage 1 through stage 4 chronic kidney disease, or unspecified chronic kidney disease: Secondary | ICD-10-CM | POA: Diagnosis not present

## 2022-04-09 DIAGNOSIS — G8929 Other chronic pain: Secondary | ICD-10-CM | POA: Diagnosis not present

## 2022-04-09 DIAGNOSIS — M549 Dorsalgia, unspecified: Secondary | ICD-10-CM | POA: Diagnosis not present

## 2022-04-09 DIAGNOSIS — J4489 Other specified chronic obstructive pulmonary disease: Secondary | ICD-10-CM | POA: Diagnosis not present

## 2022-04-09 DIAGNOSIS — I2510 Atherosclerotic heart disease of native coronary artery without angina pectoris: Secondary | ICD-10-CM | POA: Diagnosis not present

## 2022-04-09 DIAGNOSIS — E1140 Type 2 diabetes mellitus with diabetic neuropathy, unspecified: Secondary | ICD-10-CM | POA: Diagnosis not present

## 2022-04-09 DIAGNOSIS — E1122 Type 2 diabetes mellitus with diabetic chronic kidney disease: Secondary | ICD-10-CM | POA: Diagnosis not present

## 2022-04-09 DIAGNOSIS — G4733 Obstructive sleep apnea (adult) (pediatric): Secondary | ICD-10-CM | POA: Diagnosis not present

## 2022-04-11 DIAGNOSIS — N1831 Chronic kidney disease, stage 3a: Secondary | ICD-10-CM | POA: Diagnosis not present

## 2022-04-11 DIAGNOSIS — E1122 Type 2 diabetes mellitus with diabetic chronic kidney disease: Secondary | ICD-10-CM | POA: Diagnosis not present

## 2022-04-11 DIAGNOSIS — I129 Hypertensive chronic kidney disease with stage 1 through stage 4 chronic kidney disease, or unspecified chronic kidney disease: Secondary | ICD-10-CM | POA: Diagnosis not present

## 2022-04-11 DIAGNOSIS — I2510 Atherosclerotic heart disease of native coronary artery without angina pectoris: Secondary | ICD-10-CM | POA: Diagnosis not present

## 2022-04-11 DIAGNOSIS — M549 Dorsalgia, unspecified: Secondary | ICD-10-CM | POA: Diagnosis not present

## 2022-04-11 DIAGNOSIS — G4733 Obstructive sleep apnea (adult) (pediatric): Secondary | ICD-10-CM | POA: Diagnosis not present

## 2022-04-11 DIAGNOSIS — E1140 Type 2 diabetes mellitus with diabetic neuropathy, unspecified: Secondary | ICD-10-CM | POA: Diagnosis not present

## 2022-04-11 DIAGNOSIS — G8929 Other chronic pain: Secondary | ICD-10-CM | POA: Diagnosis not present

## 2022-04-11 DIAGNOSIS — J4489 Other specified chronic obstructive pulmonary disease: Secondary | ICD-10-CM | POA: Diagnosis not present

## 2022-04-20 DIAGNOSIS — N1831 Chronic kidney disease, stage 3a: Secondary | ICD-10-CM | POA: Diagnosis not present

## 2022-04-20 DIAGNOSIS — I2510 Atherosclerotic heart disease of native coronary artery without angina pectoris: Secondary | ICD-10-CM | POA: Diagnosis not present

## 2022-04-20 DIAGNOSIS — G8929 Other chronic pain: Secondary | ICD-10-CM | POA: Diagnosis not present

## 2022-04-20 DIAGNOSIS — J4489 Other specified chronic obstructive pulmonary disease: Secondary | ICD-10-CM | POA: Diagnosis not present

## 2022-04-20 DIAGNOSIS — G4733 Obstructive sleep apnea (adult) (pediatric): Secondary | ICD-10-CM | POA: Diagnosis not present

## 2022-04-20 DIAGNOSIS — E1122 Type 2 diabetes mellitus with diabetic chronic kidney disease: Secondary | ICD-10-CM | POA: Diagnosis not present

## 2022-04-20 DIAGNOSIS — M549 Dorsalgia, unspecified: Secondary | ICD-10-CM | POA: Diagnosis not present

## 2022-04-20 DIAGNOSIS — I129 Hypertensive chronic kidney disease with stage 1 through stage 4 chronic kidney disease, or unspecified chronic kidney disease: Secondary | ICD-10-CM | POA: Diagnosis not present

## 2022-04-20 DIAGNOSIS — E1140 Type 2 diabetes mellitus with diabetic neuropathy, unspecified: Secondary | ICD-10-CM | POA: Diagnosis not present

## 2022-04-21 DIAGNOSIS — J310 Chronic rhinitis: Secondary | ICD-10-CM | POA: Diagnosis not present

## 2022-04-21 DIAGNOSIS — R0982 Postnasal drip: Secondary | ICD-10-CM | POA: Diagnosis not present

## 2022-04-21 DIAGNOSIS — H6123 Impacted cerumen, bilateral: Secondary | ICD-10-CM | POA: Diagnosis not present

## 2022-04-24 DIAGNOSIS — I10 Essential (primary) hypertension: Secondary | ICD-10-CM | POA: Diagnosis not present

## 2022-04-24 DIAGNOSIS — N1831 Chronic kidney disease, stage 3a: Secondary | ICD-10-CM | POA: Diagnosis not present

## 2022-04-24 DIAGNOSIS — F329 Major depressive disorder, single episode, unspecified: Secondary | ICD-10-CM | POA: Diagnosis not present

## 2022-04-24 DIAGNOSIS — E7849 Other hyperlipidemia: Secondary | ICD-10-CM | POA: Diagnosis not present

## 2022-04-24 DIAGNOSIS — J449 Chronic obstructive pulmonary disease, unspecified: Secondary | ICD-10-CM | POA: Diagnosis not present

## 2022-04-29 DIAGNOSIS — M549 Dorsalgia, unspecified: Secondary | ICD-10-CM | POA: Diagnosis not present

## 2022-04-29 DIAGNOSIS — I2510 Atherosclerotic heart disease of native coronary artery without angina pectoris: Secondary | ICD-10-CM | POA: Diagnosis not present

## 2022-04-29 DIAGNOSIS — G8929 Other chronic pain: Secondary | ICD-10-CM | POA: Diagnosis not present

## 2022-04-29 DIAGNOSIS — G4733 Obstructive sleep apnea (adult) (pediatric): Secondary | ICD-10-CM | POA: Diagnosis not present

## 2022-04-29 DIAGNOSIS — E1122 Type 2 diabetes mellitus with diabetic chronic kidney disease: Secondary | ICD-10-CM | POA: Diagnosis not present

## 2022-04-29 DIAGNOSIS — I129 Hypertensive chronic kidney disease with stage 1 through stage 4 chronic kidney disease, or unspecified chronic kidney disease: Secondary | ICD-10-CM | POA: Diagnosis not present

## 2022-04-29 DIAGNOSIS — J4489 Other specified chronic obstructive pulmonary disease: Secondary | ICD-10-CM | POA: Diagnosis not present

## 2022-04-29 DIAGNOSIS — N1831 Chronic kidney disease, stage 3a: Secondary | ICD-10-CM | POA: Diagnosis not present

## 2022-04-29 DIAGNOSIS — E1140 Type 2 diabetes mellitus with diabetic neuropathy, unspecified: Secondary | ICD-10-CM | POA: Diagnosis not present

## 2022-05-09 DIAGNOSIS — M533 Sacrococcygeal disorders, not elsewhere classified: Secondary | ICD-10-CM | POA: Diagnosis not present

## 2022-05-31 DIAGNOSIS — M7051 Other bursitis of knee, right knee: Secondary | ICD-10-CM | POA: Diagnosis not present

## 2022-05-31 DIAGNOSIS — M170 Bilateral primary osteoarthritis of knee: Secondary | ICD-10-CM | POA: Diagnosis not present

## 2022-05-31 DIAGNOSIS — M7052 Other bursitis of knee, left knee: Secondary | ICD-10-CM | POA: Diagnosis not present

## 2022-06-01 ENCOUNTER — Ambulatory Visit: Payer: Medicare HMO | Admitting: Podiatry

## 2022-06-06 DIAGNOSIS — F02818 Dementia in other diseases classified elsewhere, unspecified severity, with other behavioral disturbance: Secondary | ICD-10-CM | POA: Diagnosis not present

## 2022-06-06 DIAGNOSIS — F0284 Dementia in other diseases classified elsewhere, unspecified severity, with anxiety: Secondary | ICD-10-CM | POA: Diagnosis not present

## 2022-06-06 DIAGNOSIS — F0154 Vascular dementia, unspecified severity, with anxiety: Secondary | ICD-10-CM | POA: Diagnosis not present

## 2022-06-06 DIAGNOSIS — F329 Major depressive disorder, single episode, unspecified: Secondary | ICD-10-CM | POA: Diagnosis not present

## 2022-06-06 DIAGNOSIS — F0153 Vascular dementia, unspecified severity, with mood disturbance: Secondary | ICD-10-CM | POA: Diagnosis not present

## 2022-06-06 DIAGNOSIS — F0283 Dementia in other diseases classified elsewhere, unspecified severity, with mood disturbance: Secondary | ICD-10-CM | POA: Diagnosis not present

## 2022-06-06 DIAGNOSIS — F01518 Vascular dementia, unspecified severity, with other behavioral disturbance: Secondary | ICD-10-CM | POA: Diagnosis not present

## 2022-06-06 DIAGNOSIS — F411 Generalized anxiety disorder: Secondary | ICD-10-CM | POA: Diagnosis not present

## 2022-06-06 DIAGNOSIS — G309 Alzheimer's disease, unspecified: Secondary | ICD-10-CM | POA: Diagnosis not present

## 2022-06-07 DIAGNOSIS — R0781 Pleurodynia: Secondary | ICD-10-CM | POA: Diagnosis not present

## 2022-06-08 DIAGNOSIS — F411 Generalized anxiety disorder: Secondary | ICD-10-CM | POA: Diagnosis not present

## 2022-06-08 DIAGNOSIS — F02818 Dementia in other diseases classified elsewhere, unspecified severity, with other behavioral disturbance: Secondary | ICD-10-CM | POA: Diagnosis not present

## 2022-06-08 DIAGNOSIS — F01518 Vascular dementia, unspecified severity, with other behavioral disturbance: Secondary | ICD-10-CM | POA: Diagnosis not present

## 2022-06-08 DIAGNOSIS — F0284 Dementia in other diseases classified elsewhere, unspecified severity, with anxiety: Secondary | ICD-10-CM | POA: Diagnosis not present

## 2022-06-08 DIAGNOSIS — F0153 Vascular dementia, unspecified severity, with mood disturbance: Secondary | ICD-10-CM | POA: Diagnosis not present

## 2022-06-08 DIAGNOSIS — G309 Alzheimer's disease, unspecified: Secondary | ICD-10-CM | POA: Diagnosis not present

## 2022-06-08 DIAGNOSIS — F329 Major depressive disorder, single episode, unspecified: Secondary | ICD-10-CM | POA: Diagnosis not present

## 2022-06-08 DIAGNOSIS — F0283 Dementia in other diseases classified elsewhere, unspecified severity, with mood disturbance: Secondary | ICD-10-CM | POA: Diagnosis not present

## 2022-06-08 DIAGNOSIS — F0154 Vascular dementia, unspecified severity, with anxiety: Secondary | ICD-10-CM | POA: Diagnosis not present

## 2022-06-09 DIAGNOSIS — R296 Repeated falls: Secondary | ICD-10-CM | POA: Diagnosis not present

## 2022-06-09 DIAGNOSIS — F329 Major depressive disorder, single episode, unspecified: Secondary | ICD-10-CM | POA: Diagnosis not present

## 2022-06-09 DIAGNOSIS — F0283 Dementia in other diseases classified elsewhere, unspecified severity, with mood disturbance: Secondary | ICD-10-CM | POA: Diagnosis not present

## 2022-06-09 DIAGNOSIS — F0153 Vascular dementia, unspecified severity, with mood disturbance: Secondary | ICD-10-CM | POA: Diagnosis not present

## 2022-06-09 DIAGNOSIS — F411 Generalized anxiety disorder: Secondary | ICD-10-CM | POA: Diagnosis not present

## 2022-06-09 DIAGNOSIS — F0284 Dementia in other diseases classified elsewhere, unspecified severity, with anxiety: Secondary | ICD-10-CM | POA: Diagnosis not present

## 2022-06-09 DIAGNOSIS — G309 Alzheimer's disease, unspecified: Secondary | ICD-10-CM | POA: Diagnosis not present

## 2022-06-09 DIAGNOSIS — F01518 Vascular dementia, unspecified severity, with other behavioral disturbance: Secondary | ICD-10-CM | POA: Diagnosis not present

## 2022-06-09 DIAGNOSIS — R269 Unspecified abnormalities of gait and mobility: Secondary | ICD-10-CM | POA: Diagnosis not present

## 2022-06-09 DIAGNOSIS — F0154 Vascular dementia, unspecified severity, with anxiety: Secondary | ICD-10-CM | POA: Diagnosis not present

## 2022-06-09 DIAGNOSIS — S0990XA Unspecified injury of head, initial encounter: Secondary | ICD-10-CM | POA: Diagnosis not present

## 2022-06-09 DIAGNOSIS — F02818 Dementia in other diseases classified elsewhere, unspecified severity, with other behavioral disturbance: Secondary | ICD-10-CM | POA: Diagnosis not present

## 2022-06-20 DIAGNOSIS — M7052 Other bursitis of knee, left knee: Secondary | ICD-10-CM | POA: Diagnosis not present

## 2022-06-20 DIAGNOSIS — M170 Bilateral primary osteoarthritis of knee: Secondary | ICD-10-CM | POA: Diagnosis not present

## 2022-06-20 DIAGNOSIS — M7051 Other bursitis of knee, right knee: Secondary | ICD-10-CM | POA: Diagnosis not present

## 2022-06-23 DIAGNOSIS — Z8673 Personal history of transient ischemic attack (TIA), and cerebral infarction without residual deficits: Secondary | ICD-10-CM | POA: Diagnosis not present

## 2022-06-23 DIAGNOSIS — Z8659 Personal history of other mental and behavioral disorders: Secondary | ICD-10-CM | POA: Diagnosis not present

## 2022-06-23 DIAGNOSIS — F0284 Dementia in other diseases classified elsewhere, unspecified severity, with anxiety: Secondary | ICD-10-CM | POA: Diagnosis not present

## 2022-06-23 DIAGNOSIS — W19XXXA Unspecified fall, initial encounter: Secondary | ICD-10-CM | POA: Diagnosis not present

## 2022-06-23 DIAGNOSIS — F411 Generalized anxiety disorder: Secondary | ICD-10-CM | POA: Diagnosis not present

## 2022-06-23 DIAGNOSIS — F0153 Vascular dementia, unspecified severity, with mood disturbance: Secondary | ICD-10-CM | POA: Diagnosis not present

## 2022-06-23 DIAGNOSIS — R296 Repeated falls: Secondary | ICD-10-CM | POA: Diagnosis not present

## 2022-06-23 DIAGNOSIS — F02818 Dementia in other diseases classified elsewhere, unspecified severity, with other behavioral disturbance: Secondary | ICD-10-CM | POA: Diagnosis not present

## 2022-06-23 DIAGNOSIS — G309 Alzheimer's disease, unspecified: Secondary | ICD-10-CM | POA: Diagnosis not present

## 2022-06-23 DIAGNOSIS — F0154 Vascular dementia, unspecified severity, with anxiety: Secondary | ICD-10-CM | POA: Diagnosis not present

## 2022-06-23 DIAGNOSIS — F0283 Dementia in other diseases classified elsewhere, unspecified severity, with mood disturbance: Secondary | ICD-10-CM | POA: Diagnosis not present

## 2022-06-23 DIAGNOSIS — F01518 Vascular dementia, unspecified severity, with other behavioral disturbance: Secondary | ICD-10-CM | POA: Diagnosis not present

## 2022-06-23 DIAGNOSIS — F329 Major depressive disorder, single episode, unspecified: Secondary | ICD-10-CM | POA: Diagnosis not present

## 2022-06-24 DIAGNOSIS — G309 Alzheimer's disease, unspecified: Secondary | ICD-10-CM | POA: Diagnosis not present

## 2022-06-24 DIAGNOSIS — Z9189 Other specified personal risk factors, not elsewhere classified: Secondary | ICD-10-CM | POA: Diagnosis not present

## 2022-06-24 DIAGNOSIS — R1312 Dysphagia, oropharyngeal phase: Secondary | ICD-10-CM | POA: Diagnosis not present

## 2022-06-25 ENCOUNTER — Other Ambulatory Visit: Payer: Self-pay | Admitting: Neurology

## 2022-07-08 DIAGNOSIS — F411 Generalized anxiety disorder: Secondary | ICD-10-CM | POA: Diagnosis not present

## 2022-07-08 DIAGNOSIS — F0283 Dementia in other diseases classified elsewhere, unspecified severity, with mood disturbance: Secondary | ICD-10-CM | POA: Diagnosis not present

## 2022-07-08 DIAGNOSIS — F0284 Dementia in other diseases classified elsewhere, unspecified severity, with anxiety: Secondary | ICD-10-CM | POA: Diagnosis not present

## 2022-07-08 DIAGNOSIS — I129 Hypertensive chronic kidney disease with stage 1 through stage 4 chronic kidney disease, or unspecified chronic kidney disease: Secondary | ICD-10-CM | POA: Diagnosis not present

## 2022-07-08 DIAGNOSIS — R1310 Dysphagia, unspecified: Secondary | ICD-10-CM | POA: Diagnosis not present

## 2022-07-08 DIAGNOSIS — G309 Alzheimer's disease, unspecified: Secondary | ICD-10-CM | POA: Diagnosis not present

## 2022-07-08 DIAGNOSIS — F329 Major depressive disorder, single episode, unspecified: Secondary | ICD-10-CM | POA: Diagnosis not present

## 2022-07-08 DIAGNOSIS — F0153 Vascular dementia, unspecified severity, with mood disturbance: Secondary | ICD-10-CM | POA: Diagnosis not present

## 2022-07-08 DIAGNOSIS — F0154 Vascular dementia, unspecified severity, with anxiety: Secondary | ICD-10-CM | POA: Diagnosis not present

## 2022-07-12 DIAGNOSIS — M25562 Pain in left knee: Secondary | ICD-10-CM | POA: Diagnosis not present

## 2022-07-13 DIAGNOSIS — F329 Major depressive disorder, single episode, unspecified: Secondary | ICD-10-CM | POA: Diagnosis not present

## 2022-07-13 DIAGNOSIS — F0283 Dementia in other diseases classified elsewhere, unspecified severity, with mood disturbance: Secondary | ICD-10-CM | POA: Diagnosis not present

## 2022-07-13 DIAGNOSIS — F411 Generalized anxiety disorder: Secondary | ICD-10-CM | POA: Diagnosis not present

## 2022-07-13 DIAGNOSIS — F0154 Vascular dementia, unspecified severity, with anxiety: Secondary | ICD-10-CM | POA: Diagnosis not present

## 2022-07-13 DIAGNOSIS — I129 Hypertensive chronic kidney disease with stage 1 through stage 4 chronic kidney disease, or unspecified chronic kidney disease: Secondary | ICD-10-CM | POA: Diagnosis not present

## 2022-07-13 DIAGNOSIS — F0153 Vascular dementia, unspecified severity, with mood disturbance: Secondary | ICD-10-CM | POA: Diagnosis not present

## 2022-07-13 DIAGNOSIS — G309 Alzheimer's disease, unspecified: Secondary | ICD-10-CM | POA: Diagnosis not present

## 2022-07-13 DIAGNOSIS — F0284 Dementia in other diseases classified elsewhere, unspecified severity, with anxiety: Secondary | ICD-10-CM | POA: Diagnosis not present

## 2022-07-13 DIAGNOSIS — R1310 Dysphagia, unspecified: Secondary | ICD-10-CM | POA: Diagnosis not present

## 2022-07-14 DIAGNOSIS — Z961 Presence of intraocular lens: Secondary | ICD-10-CM | POA: Diagnosis not present

## 2022-07-14 DIAGNOSIS — H401133 Primary open-angle glaucoma, bilateral, severe stage: Secondary | ICD-10-CM | POA: Diagnosis not present

## 2022-07-14 DIAGNOSIS — H35033 Hypertensive retinopathy, bilateral: Secondary | ICD-10-CM | POA: Diagnosis not present

## 2022-07-14 DIAGNOSIS — E1165 Type 2 diabetes mellitus with hyperglycemia: Secondary | ICD-10-CM | POA: Diagnosis not present

## 2022-07-20 DIAGNOSIS — G301 Alzheimer's disease with late onset: Secondary | ICD-10-CM | POA: Diagnosis not present

## 2022-07-20 DIAGNOSIS — F4312 Post-traumatic stress disorder, chronic: Secondary | ICD-10-CM | POA: Diagnosis not present

## 2022-07-20 DIAGNOSIS — F3341 Major depressive disorder, recurrent, in partial remission: Secondary | ICD-10-CM | POA: Diagnosis not present

## 2022-08-02 DIAGNOSIS — N1832 Chronic kidney disease, stage 3b: Secondary | ICD-10-CM | POA: Diagnosis not present

## 2022-08-02 DIAGNOSIS — R1312 Dysphagia, oropharyngeal phase: Secondary | ICD-10-CM | POA: Diagnosis not present

## 2022-08-02 DIAGNOSIS — F329 Major depressive disorder, single episode, unspecified: Secondary | ICD-10-CM | POA: Diagnosis not present

## 2022-08-02 DIAGNOSIS — G309 Alzheimer's disease, unspecified: Secondary | ICD-10-CM | POA: Diagnosis not present

## 2022-08-02 DIAGNOSIS — F411 Generalized anxiety disorder: Secondary | ICD-10-CM | POA: Diagnosis not present

## 2022-08-02 DIAGNOSIS — F0283 Dementia in other diseases classified elsewhere, unspecified severity, with mood disturbance: Secondary | ICD-10-CM | POA: Diagnosis not present

## 2022-08-02 DIAGNOSIS — E1122 Type 2 diabetes mellitus with diabetic chronic kidney disease: Secondary | ICD-10-CM | POA: Diagnosis not present

## 2022-08-02 DIAGNOSIS — I129 Hypertensive chronic kidney disease with stage 1 through stage 4 chronic kidney disease, or unspecified chronic kidney disease: Secondary | ICD-10-CM | POA: Diagnosis not present

## 2022-08-02 DIAGNOSIS — F0284 Dementia in other diseases classified elsewhere, unspecified severity, with anxiety: Secondary | ICD-10-CM | POA: Diagnosis not present

## 2022-08-05 DIAGNOSIS — I7103 Dissection of thoracoabdominal aorta: Secondary | ICD-10-CM | POA: Diagnosis not present

## 2022-08-05 DIAGNOSIS — Z9889 Other specified postprocedural states: Secondary | ICD-10-CM | POA: Diagnosis not present

## 2022-08-05 DIAGNOSIS — I7772 Dissection of iliac artery: Secondary | ICD-10-CM | POA: Diagnosis not present

## 2022-08-05 DIAGNOSIS — Z8679 Personal history of other diseases of the circulatory system: Secondary | ICD-10-CM | POA: Diagnosis not present

## 2022-08-09 DIAGNOSIS — I129 Hypertensive chronic kidney disease with stage 1 through stage 4 chronic kidney disease, or unspecified chronic kidney disease: Secondary | ICD-10-CM | POA: Diagnosis not present

## 2022-08-09 DIAGNOSIS — F0284 Dementia in other diseases classified elsewhere, unspecified severity, with anxiety: Secondary | ICD-10-CM | POA: Diagnosis not present

## 2022-08-09 DIAGNOSIS — R1312 Dysphagia, oropharyngeal phase: Secondary | ICD-10-CM | POA: Diagnosis not present

## 2022-08-09 DIAGNOSIS — F329 Major depressive disorder, single episode, unspecified: Secondary | ICD-10-CM | POA: Diagnosis not present

## 2022-08-09 DIAGNOSIS — F0283 Dementia in other diseases classified elsewhere, unspecified severity, with mood disturbance: Secondary | ICD-10-CM | POA: Diagnosis not present

## 2022-08-09 DIAGNOSIS — G309 Alzheimer's disease, unspecified: Secondary | ICD-10-CM | POA: Diagnosis not present

## 2022-08-09 DIAGNOSIS — E1122 Type 2 diabetes mellitus with diabetic chronic kidney disease: Secondary | ICD-10-CM | POA: Diagnosis not present

## 2022-08-09 DIAGNOSIS — F411 Generalized anxiety disorder: Secondary | ICD-10-CM | POA: Diagnosis not present

## 2022-08-09 DIAGNOSIS — N1832 Chronic kidney disease, stage 3b: Secondary | ICD-10-CM | POA: Diagnosis not present

## 2022-08-10 DIAGNOSIS — I129 Hypertensive chronic kidney disease with stage 1 through stage 4 chronic kidney disease, or unspecified chronic kidney disease: Secondary | ICD-10-CM | POA: Diagnosis not present

## 2022-08-10 DIAGNOSIS — E1122 Type 2 diabetes mellitus with diabetic chronic kidney disease: Secondary | ICD-10-CM | POA: Diagnosis not present

## 2022-08-10 DIAGNOSIS — R1312 Dysphagia, oropharyngeal phase: Secondary | ICD-10-CM | POA: Diagnosis not present

## 2022-08-10 DIAGNOSIS — F0283 Dementia in other diseases classified elsewhere, unspecified severity, with mood disturbance: Secondary | ICD-10-CM | POA: Diagnosis not present

## 2022-08-10 DIAGNOSIS — F0284 Dementia in other diseases classified elsewhere, unspecified severity, with anxiety: Secondary | ICD-10-CM | POA: Diagnosis not present

## 2022-08-10 DIAGNOSIS — N1832 Chronic kidney disease, stage 3b: Secondary | ICD-10-CM | POA: Diagnosis not present

## 2022-08-10 DIAGNOSIS — G309 Alzheimer's disease, unspecified: Secondary | ICD-10-CM | POA: Diagnosis not present

## 2022-08-10 DIAGNOSIS — F411 Generalized anxiety disorder: Secondary | ICD-10-CM | POA: Diagnosis not present

## 2022-08-10 DIAGNOSIS — F329 Major depressive disorder, single episode, unspecified: Secondary | ICD-10-CM | POA: Diagnosis not present

## 2022-08-11 DIAGNOSIS — I129 Hypertensive chronic kidney disease with stage 1 through stage 4 chronic kidney disease, or unspecified chronic kidney disease: Secondary | ICD-10-CM | POA: Diagnosis not present

## 2022-08-11 DIAGNOSIS — E1140 Type 2 diabetes mellitus with diabetic neuropathy, unspecified: Secondary | ICD-10-CM | POA: Diagnosis not present

## 2022-08-11 DIAGNOSIS — J4489 Other specified chronic obstructive pulmonary disease: Secondary | ICD-10-CM | POA: Diagnosis not present

## 2022-08-11 DIAGNOSIS — F0150 Vascular dementia without behavioral disturbance: Secondary | ICD-10-CM | POA: Diagnosis not present

## 2022-08-11 DIAGNOSIS — F0280 Dementia in other diseases classified elsewhere without behavioral disturbance: Secondary | ICD-10-CM | POA: Diagnosis not present

## 2022-08-11 DIAGNOSIS — G309 Alzheimer's disease, unspecified: Secondary | ICD-10-CM | POA: Diagnosis not present

## 2022-08-11 DIAGNOSIS — I25119 Atherosclerotic heart disease of native coronary artery with unspecified angina pectoris: Secondary | ICD-10-CM | POA: Diagnosis not present

## 2022-08-11 DIAGNOSIS — E1122 Type 2 diabetes mellitus with diabetic chronic kidney disease: Secondary | ICD-10-CM | POA: Diagnosis not present

## 2022-08-11 DIAGNOSIS — Z952 Presence of prosthetic heart valve: Secondary | ICD-10-CM | POA: Diagnosis not present

## 2022-08-11 DIAGNOSIS — E7849 Other hyperlipidemia: Secondary | ICD-10-CM | POA: Diagnosis not present

## 2022-08-11 DIAGNOSIS — Z0000 Encounter for general adult medical examination without abnormal findings: Secondary | ICD-10-CM | POA: Diagnosis not present

## 2022-08-11 DIAGNOSIS — E039 Hypothyroidism, unspecified: Secondary | ICD-10-CM | POA: Diagnosis not present

## 2022-08-11 DIAGNOSIS — I2510 Atherosclerotic heart disease of native coronary artery without angina pectoris: Secondary | ICD-10-CM | POA: Diagnosis not present

## 2022-08-11 DIAGNOSIS — Z125 Encounter for screening for malignant neoplasm of prostate: Secondary | ICD-10-CM | POA: Diagnosis not present

## 2022-08-12 DIAGNOSIS — I716 Thoracoabdominal aortic aneurysm, without rupture, unspecified: Secondary | ICD-10-CM | POA: Diagnosis not present

## 2022-08-15 ENCOUNTER — Other Ambulatory Visit: Payer: Self-pay | Admitting: Anesthesiology

## 2022-08-15 DIAGNOSIS — I129 Hypertensive chronic kidney disease with stage 1 through stage 4 chronic kidney disease, or unspecified chronic kidney disease: Secondary | ICD-10-CM | POA: Diagnosis not present

## 2022-08-15 DIAGNOSIS — N1832 Chronic kidney disease, stage 3b: Secondary | ICD-10-CM | POA: Diagnosis not present

## 2022-08-15 DIAGNOSIS — F411 Generalized anxiety disorder: Secondary | ICD-10-CM | POA: Diagnosis not present

## 2022-08-15 DIAGNOSIS — E1122 Type 2 diabetes mellitus with diabetic chronic kidney disease: Secondary | ICD-10-CM | POA: Diagnosis not present

## 2022-08-15 DIAGNOSIS — F329 Major depressive disorder, single episode, unspecified: Secondary | ICD-10-CM | POA: Diagnosis not present

## 2022-08-15 DIAGNOSIS — F0283 Dementia in other diseases classified elsewhere, unspecified severity, with mood disturbance: Secondary | ICD-10-CM | POA: Diagnosis not present

## 2022-08-15 DIAGNOSIS — F0284 Dementia in other diseases classified elsewhere, unspecified severity, with anxiety: Secondary | ICD-10-CM | POA: Diagnosis not present

## 2022-08-15 DIAGNOSIS — R1312 Dysphagia, oropharyngeal phase: Secondary | ICD-10-CM | POA: Diagnosis not present

## 2022-08-15 DIAGNOSIS — G309 Alzheimer's disease, unspecified: Secondary | ICD-10-CM | POA: Diagnosis not present

## 2022-08-15 MED ORDER — DONEPEZIL HCL 10 MG PO TABS: 10.0000 mg | ORAL_TABLET | Freq: Every day | ORAL | 3 refills | Status: DC

## 2022-08-16 DIAGNOSIS — F329 Major depressive disorder, single episode, unspecified: Secondary | ICD-10-CM | POA: Diagnosis not present

## 2022-08-16 DIAGNOSIS — I129 Hypertensive chronic kidney disease with stage 1 through stage 4 chronic kidney disease, or unspecified chronic kidney disease: Secondary | ICD-10-CM | POA: Diagnosis not present

## 2022-08-16 DIAGNOSIS — F0283 Dementia in other diseases classified elsewhere, unspecified severity, with mood disturbance: Secondary | ICD-10-CM | POA: Diagnosis not present

## 2022-08-16 DIAGNOSIS — F0284 Dementia in other diseases classified elsewhere, unspecified severity, with anxiety: Secondary | ICD-10-CM | POA: Diagnosis not present

## 2022-08-16 DIAGNOSIS — R1312 Dysphagia, oropharyngeal phase: Secondary | ICD-10-CM | POA: Diagnosis not present

## 2022-08-16 DIAGNOSIS — G309 Alzheimer's disease, unspecified: Secondary | ICD-10-CM | POA: Diagnosis not present

## 2022-08-16 DIAGNOSIS — E1122 Type 2 diabetes mellitus with diabetic chronic kidney disease: Secondary | ICD-10-CM | POA: Diagnosis not present

## 2022-08-16 DIAGNOSIS — F411 Generalized anxiety disorder: Secondary | ICD-10-CM | POA: Diagnosis not present

## 2022-08-16 DIAGNOSIS — N1832 Chronic kidney disease, stage 3b: Secondary | ICD-10-CM | POA: Diagnosis not present

## 2022-08-17 DIAGNOSIS — F411 Generalized anxiety disorder: Secondary | ICD-10-CM | POA: Diagnosis not present

## 2022-08-17 DIAGNOSIS — N1832 Chronic kidney disease, stage 3b: Secondary | ICD-10-CM | POA: Diagnosis not present

## 2022-08-17 DIAGNOSIS — F329 Major depressive disorder, single episode, unspecified: Secondary | ICD-10-CM | POA: Diagnosis not present

## 2022-08-17 DIAGNOSIS — R1312 Dysphagia, oropharyngeal phase: Secondary | ICD-10-CM | POA: Diagnosis not present

## 2022-08-17 DIAGNOSIS — F0283 Dementia in other diseases classified elsewhere, unspecified severity, with mood disturbance: Secondary | ICD-10-CM | POA: Diagnosis not present

## 2022-08-17 DIAGNOSIS — G309 Alzheimer's disease, unspecified: Secondary | ICD-10-CM | POA: Diagnosis not present

## 2022-08-17 DIAGNOSIS — E1122 Type 2 diabetes mellitus with diabetic chronic kidney disease: Secondary | ICD-10-CM | POA: Diagnosis not present

## 2022-08-17 DIAGNOSIS — F0284 Dementia in other diseases classified elsewhere, unspecified severity, with anxiety: Secondary | ICD-10-CM | POA: Diagnosis not present

## 2022-08-17 DIAGNOSIS — I129 Hypertensive chronic kidney disease with stage 1 through stage 4 chronic kidney disease, or unspecified chronic kidney disease: Secondary | ICD-10-CM | POA: Diagnosis not present

## 2022-08-18 DIAGNOSIS — F411 Generalized anxiety disorder: Secondary | ICD-10-CM | POA: Diagnosis not present

## 2022-08-18 DIAGNOSIS — R1312 Dysphagia, oropharyngeal phase: Secondary | ICD-10-CM | POA: Diagnosis not present

## 2022-08-18 DIAGNOSIS — N1832 Chronic kidney disease, stage 3b: Secondary | ICD-10-CM | POA: Diagnosis not present

## 2022-08-18 DIAGNOSIS — F0284 Dementia in other diseases classified elsewhere, unspecified severity, with anxiety: Secondary | ICD-10-CM | POA: Diagnosis not present

## 2022-08-18 DIAGNOSIS — F0283 Dementia in other diseases classified elsewhere, unspecified severity, with mood disturbance: Secondary | ICD-10-CM | POA: Diagnosis not present

## 2022-08-18 DIAGNOSIS — G309 Alzheimer's disease, unspecified: Secondary | ICD-10-CM | POA: Diagnosis not present

## 2022-08-18 DIAGNOSIS — H6123 Impacted cerumen, bilateral: Secondary | ICD-10-CM | POA: Diagnosis not present

## 2022-08-18 DIAGNOSIS — E1122 Type 2 diabetes mellitus with diabetic chronic kidney disease: Secondary | ICD-10-CM | POA: Diagnosis not present

## 2022-08-18 DIAGNOSIS — F329 Major depressive disorder, single episode, unspecified: Secondary | ICD-10-CM | POA: Diagnosis not present

## 2022-08-18 DIAGNOSIS — I129 Hypertensive chronic kidney disease with stage 1 through stage 4 chronic kidney disease, or unspecified chronic kidney disease: Secondary | ICD-10-CM | POA: Diagnosis not present

## 2022-08-22 DIAGNOSIS — N1832 Chronic kidney disease, stage 3b: Secondary | ICD-10-CM | POA: Diagnosis not present

## 2022-08-22 DIAGNOSIS — F0283 Dementia in other diseases classified elsewhere, unspecified severity, with mood disturbance: Secondary | ICD-10-CM | POA: Diagnosis not present

## 2022-08-22 DIAGNOSIS — E1122 Type 2 diabetes mellitus with diabetic chronic kidney disease: Secondary | ICD-10-CM | POA: Diagnosis not present

## 2022-08-22 DIAGNOSIS — F0284 Dementia in other diseases classified elsewhere, unspecified severity, with anxiety: Secondary | ICD-10-CM | POA: Diagnosis not present

## 2022-08-22 DIAGNOSIS — F329 Major depressive disorder, single episode, unspecified: Secondary | ICD-10-CM | POA: Diagnosis not present

## 2022-08-22 DIAGNOSIS — F411 Generalized anxiety disorder: Secondary | ICD-10-CM | POA: Diagnosis not present

## 2022-08-22 DIAGNOSIS — G309 Alzheimer's disease, unspecified: Secondary | ICD-10-CM | POA: Diagnosis not present

## 2022-08-22 DIAGNOSIS — R1312 Dysphagia, oropharyngeal phase: Secondary | ICD-10-CM | POA: Diagnosis not present

## 2022-08-22 DIAGNOSIS — I129 Hypertensive chronic kidney disease with stage 1 through stage 4 chronic kidney disease, or unspecified chronic kidney disease: Secondary | ICD-10-CM | POA: Diagnosis not present

## 2022-08-23 DIAGNOSIS — F329 Major depressive disorder, single episode, unspecified: Secondary | ICD-10-CM | POA: Diagnosis not present

## 2022-08-23 DIAGNOSIS — I129 Hypertensive chronic kidney disease with stage 1 through stage 4 chronic kidney disease, or unspecified chronic kidney disease: Secondary | ICD-10-CM | POA: Diagnosis not present

## 2022-08-23 DIAGNOSIS — N1832 Chronic kidney disease, stage 3b: Secondary | ICD-10-CM | POA: Diagnosis not present

## 2022-08-23 DIAGNOSIS — F411 Generalized anxiety disorder: Secondary | ICD-10-CM | POA: Diagnosis not present

## 2022-08-23 DIAGNOSIS — G309 Alzheimer's disease, unspecified: Secondary | ICD-10-CM | POA: Diagnosis not present

## 2022-08-23 DIAGNOSIS — R1312 Dysphagia, oropharyngeal phase: Secondary | ICD-10-CM | POA: Diagnosis not present

## 2022-08-23 DIAGNOSIS — E1122 Type 2 diabetes mellitus with diabetic chronic kidney disease: Secondary | ICD-10-CM | POA: Diagnosis not present

## 2022-08-23 DIAGNOSIS — F0283 Dementia in other diseases classified elsewhere, unspecified severity, with mood disturbance: Secondary | ICD-10-CM | POA: Diagnosis not present

## 2022-08-23 DIAGNOSIS — F0284 Dementia in other diseases classified elsewhere, unspecified severity, with anxiety: Secondary | ICD-10-CM | POA: Diagnosis not present

## 2022-08-25 ENCOUNTER — Ambulatory Visit: Payer: Medicare HMO | Admitting: Neurology

## 2022-08-25 VITALS — BP 167/74 | HR 72 | Ht 62.0 in | Wt 175.8 lb

## 2022-08-25 DIAGNOSIS — G309 Alzheimer's disease, unspecified: Secondary | ICD-10-CM | POA: Diagnosis not present

## 2022-08-25 DIAGNOSIS — F0283 Dementia in other diseases classified elsewhere, unspecified severity, with mood disturbance: Secondary | ICD-10-CM | POA: Diagnosis not present

## 2022-08-25 DIAGNOSIS — F0284 Dementia in other diseases classified elsewhere, unspecified severity, with anxiety: Secondary | ICD-10-CM | POA: Diagnosis not present

## 2022-08-25 DIAGNOSIS — I611 Nontraumatic intracerebral hemorrhage in hemisphere, cortical: Secondary | ICD-10-CM | POA: Diagnosis not present

## 2022-08-25 DIAGNOSIS — N1832 Chronic kidney disease, stage 3b: Secondary | ICD-10-CM | POA: Diagnosis not present

## 2022-08-25 DIAGNOSIS — F0280 Dementia in other diseases classified elsewhere without behavioral disturbance: Secondary | ICD-10-CM | POA: Diagnosis not present

## 2022-08-25 DIAGNOSIS — F0150 Vascular dementia without behavioral disturbance: Secondary | ICD-10-CM

## 2022-08-25 DIAGNOSIS — I129 Hypertensive chronic kidney disease with stage 1 through stage 4 chronic kidney disease, or unspecified chronic kidney disease: Secondary | ICD-10-CM | POA: Diagnosis not present

## 2022-08-25 DIAGNOSIS — E1122 Type 2 diabetes mellitus with diabetic chronic kidney disease: Secondary | ICD-10-CM | POA: Diagnosis not present

## 2022-08-25 DIAGNOSIS — R1312 Dysphagia, oropharyngeal phase: Secondary | ICD-10-CM | POA: Diagnosis not present

## 2022-08-25 DIAGNOSIS — F411 Generalized anxiety disorder: Secondary | ICD-10-CM | POA: Diagnosis not present

## 2022-08-25 DIAGNOSIS — F329 Major depressive disorder, single episode, unspecified: Secondary | ICD-10-CM | POA: Diagnosis not present

## 2022-08-25 MED ORDER — MEMANTINE HCL 10 MG PO TABS: 10.0000 mg | ORAL_TABLET | Freq: Two times a day (BID) | ORAL | 5 refills | Status: DC

## 2022-08-25 MED ORDER — MEMANTINE HCL 28 X 5 MG & 21 X 10 MG PO TABS: ORAL_TABLET | ORAL | 12 refills | Status: DC

## 2022-08-25 NOTE — Progress Notes (Signed)
[facility] [address] [place]. [place] [address] [phone]       OFFICE FOLLOW-UP NOTE  Mr. Marcquis Ridlon Date of Birth:  [birth year] Medical Record Number:  [id number]   HPI: Patient is visit 02/10/2022 Mr Boyd  is a 77 year old hispanic male seen today for initial office follow up visit after hospital admission for ICH in August 2023. He is accompanied by his wife, son and grandson and history is obtained from them and I have personally reviewed electronic medical records and pertinent available imaging films in PACS.Bud Kaeser is a 77 y.o. male with PMH significant for history of ascending aortic dissection, chronic back pain and L5 fracture, degenerative disc disease, hypertension, hard of hearing and underlying dementia who had a fall on 08/14/2021.  He was seen by the PCP with no focal deficit on exam.  He had a screening outpatient CT head without contrast which demonstrated a small 10 mm acute intraparenchymal hemorrhage at the junction of the right lentiform nucleus and the retrolenticular white matter.  He takes a baby aspirin at home and took it today.  He is not on any anticoagulation.  No prior history of stroke or ICH.  He does have hypertension the family monitors his blood pressures about twice a day and notes that it ranges from 120 systolics to 140s.He does not smoke, no alcohol intake.  He was admitted to the intensive care unit and blood pressure was tightly controlled.  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  2D echo showed ejection fraction of 60 to 65% with grade 1 diastolic dysfunction.  Bioprosthetic aortic valve.  LDL cholesterol was 47 mg percent.  Hemoglobin A1c was 4.5.  His aspirin was held.  Does not have much physical deficits except for his cognitive impairment.  He was discharged home to the care of  his family with home physical occupational and speech therapy which she is still getting.  Family feels his blood pressure is still not controlled and he is cheats on his food and gets up in the middle of the night and splurges on food from the refrigerator food that he is not supposed to eat.  He is able to walk with a wheeled walker and is careful and has not had major falls.  He needs 24-hour supervision and care.  He has not been started on medications like Aricept and Namenda for his dementia. Update 08/25/2022 : He returns for follow-up after last visit 6 months ago.  He is accompanied by his wife, son and grandson.  The family feels patient is tolerating Aricept well without side effects.  He may have obtained slight improvement.  He is more participating in conversation with families and is able to recognize most family members.  Continues to live at home with his wife.  He does not talk as much.  His blood pressure at home is quite well-controlled though it is elevated in office today at 167/74.  He is ambulating with a cane.  He has had no falls or injuries.  He has not exhibited any unsafe behavior, delusions or hallucinations.  He does have a wheeled walker but prefers not to use it.  He does have 24-hour supervision at home.  No new neurological complaints.  On MMSE testing today scored 17/30 which is slightly improved compared to last visit.  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 at proximal  anastamosis   Hypertension    SOB (shortness of breath) on exertion     Social History:  Social History   Socioeconomic History   Marital status: Married    Spouse name: Not on file   Number of children: Not on file   Years of education: Not on file   Highest education level: Not on file  Occupational History   Not on file  Tobacco Use   Smoking status: Former    Current packs/day: 0.00    Types: Cigarettes    Quit date: 03/24/2009    Years since quitting: 13.4   Smokeless tobacco: Never  Substance and Sexual Activity   Alcohol use: No   Drug use: Not on file   Sexual activity: Not on file  Other Topics Concern   Not on file  Social History Narrative   ** Merged History Encounter **       Social Determinants of Health   Financial Resource Strain: Not on file  Food Insecurity: Not on file  Transportation Needs: No Transportation Needs (12/02/2020)   Received from [facility]   PRAPARE - Transportation    Lack of Transportation (Medical): No    Lack of Transportation (Non-Medical): No  Physical Activity: Not on file  Stress: Not on file  Social Connections: Not on file  Intimate Partner Violence: Not on file    Medications:   Current Outpatient Medications on File Prior to Visit  Medication Sig Dispense Refill   albuterol (PROVENTIL) (2.5 MG/3ML) 0.083% nebulizer solution Take 2.5 mg by nebulization every 4 (four) hours as needed for wheezing or shortness of breath.     azelastine (ASTELIN) 0.1 % nasal spray Place 1 spray into both nostrils 2 (two) times daily.     brimonidine (ALPHAGAN) 0.2 % ophthalmic solution Place 1 drop into both eyes 3 (three) times daily.     CALCIUM PO Take 1 tablet by mouth daily.     clotrimazole-betamethasone (LOTRISONE) cream Apply 1 Application topically daily. 30 g 0   desvenlafaxine (PRISTIQ) 100 MG 24 hr tablet Take 100 mg by mouth daily.     donepezil (ARICEPT) 10 MG tablet Take 1  tablet (10 mg total) by mouth at bedtime. 30 tablet 3   Fluticasone-Umeclidin-Vilant (TRELEGY ELLIPTA) 100-62.5-25 MCG/ACT AEPB Inhale 1 puff into the lungs daily. SAMPLE     gabapentin (NEURONTIN) 100 MG capsule Take 100 mg by mouth 3 (three) times daily as needed (nerve pain).     ipratropium (ATROVENT) 0.06 % nasal spray Place 2 sprays into both nostrils daily.     latanoprost (XALATAN) 0.005 % ophthalmic solution Place 1 drop into both eyes at bedtime.     levocetirizine (XYZAL) 5 MG tablet Take 1 tablet (5 mg total) by mouth 2 (two) times daily as needed for allergies. 60 tablet 5   levothyroxine (SYNTHROID) 25 MCG tablet Take 25 mcg by mouth  daily.     metoprolol succinate (TOPROL-XL) 100 MG 24 hr tablet Take 100 mg by mouth at bedtime.     omeprazole (PRILOSEC) 20 MG capsule Take 20 mg by mouth daily before breakfast.     POTASSIUM PO Take 1 tablet by mouth daily.     rosuvastatin (CRESTOR) 20 MG tablet Take 20 mg by mouth at bedtime.     triamcinolone (NASACORT) 55 MCG/ACT AERO nasal inhaler Place 1 spray into the nose daily. 49.5 g 1   valACYclovir (VALTREX) 1000 MG tablet Take 1,000 mg by mouth 3 (three) times daily. (Patient not taking: Reported on 02/10/2022)     No current facility-administered medications on file prior to visit.    Allergies:  No Known Allergies  Physical Exam General: well developed, well nourished elderly hispanic male, seated, in no evident distress Head: head normocephalic and atraumatic.  Neck: supple with no carotid or supraclavicular bruits Cardiovascular: regular rate and rhythm, no murmurs Musculoskeletal: no deformity Skin:  no rash/petichiae Vascular:  Normal pulses all extremities Vitals:   08/25/22 1518  BP: (!) 167/74  Pulse: 72   Neurologic Exam Mental Status: Awake and fully alert. Oriented to place and time. Recent and remote memory poor Attention span, concentration and fund of knowledge diminished. Mood and affect appropriate. MMSE  17/30 Cranial Nerves: Fundoscopic exam reveals sharp disc margins. Pupils equal, briskly reactive to light. Extraocular movements full without nystagmus. Visual fields full to confrontation. Hearing intact. Facial sensation intact. Face, tongue, palate moves normally and symmetrically.  Motor: Normal bulk and tone. Normal strength in all tested extremity muscles. Sensory.: intact to touch ,pinprick .position and vibratory sensation.  Coordination: Rapid alternating movements normal in all extremities. Finger-to-nose and heel-to-shin performed accurately bilaterally. Gait and Station: Arises from chair without difficulty. Stance is normal. Gait  wide based slightly ataxic while turning. Tandem gait not assessed Reflexes: 1+ and symmetric. Toes downgoing.   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  He is tolerating Aricept well with only slight improvement on cognitive testing.    PLAN: I had a long discussion with the patient, his wife and son regarding his recent right basal ganglia hemorrhage likely related to hypertension as well as his baseline mixed dementia and answered questions.  Recommend strict control of hypertension with blood pressure goal below 140/90 and aggressive risk factor modification.  Continue ongoing physical occupational and speech therapy.  Patient encouraged to increase  participation in cognitively challenging activities like solving crossword puzzles, playing bridge and sudoku.  We also discussed memory compensation strategies.  Continue Aricept   10 mg daily  .  Trial of Namenda starter pack and if tolerated increase to 10 mg twice daily.  Return for follow-up in 6 months or call earlier if necessary  Greater than 50% of time during this prolonged 40  minute visit was spent on counseling,explanation of diagnosis, planning of further management, discussion with patient and family and coordination of care Delia Heady, MD Note: This document was prepared with digital dictation and possible smart phrase technology. Any transcriptional errors that result from this process are unintentional

## 2022-08-25 NOTE — Patient Instructions (Addendum)
I had a long discussion with the patient, his wife and son regarding his recent right basal ganglia hemorrhage likely related to hypertension as well as his baseline mixed dementia and answered questions.  Recommend strict control of hypertension with blood pressure goal below 140/90 and aggressive risk factor modification.  Continue ongoing physical occupational and speech therapy.  Patient encouraged to increase participation in cognitively challenging activities like solving crossword puzzles, playing bridge and sudoku.  We also discussed memory compensation strategies.  Continue Aricept   10 mg daily  .  Trial of Namenda starter pack and if tolerated increase to 10 mg twice daily.  Return for follow-up in 6 months or call earlier if necessary.

## 2022-08-29 DIAGNOSIS — F329 Major depressive disorder, single episode, unspecified: Secondary | ICD-10-CM | POA: Diagnosis not present

## 2022-08-29 DIAGNOSIS — F411 Generalized anxiety disorder: Secondary | ICD-10-CM | POA: Diagnosis not present

## 2022-08-29 DIAGNOSIS — F0283 Dementia in other diseases classified elsewhere, unspecified severity, with mood disturbance: Secondary | ICD-10-CM | POA: Diagnosis not present

## 2022-08-29 DIAGNOSIS — N1832 Chronic kidney disease, stage 3b: Secondary | ICD-10-CM | POA: Diagnosis not present

## 2022-08-29 DIAGNOSIS — E1122 Type 2 diabetes mellitus with diabetic chronic kidney disease: Secondary | ICD-10-CM | POA: Diagnosis not present

## 2022-08-29 DIAGNOSIS — R1312 Dysphagia, oropharyngeal phase: Secondary | ICD-10-CM | POA: Diagnosis not present

## 2022-08-29 DIAGNOSIS — G309 Alzheimer's disease, unspecified: Secondary | ICD-10-CM | POA: Diagnosis not present

## 2022-08-29 DIAGNOSIS — F0284 Dementia in other diseases classified elsewhere, unspecified severity, with anxiety: Secondary | ICD-10-CM | POA: Diagnosis not present

## 2022-08-29 DIAGNOSIS — I129 Hypertensive chronic kidney disease with stage 1 through stage 4 chronic kidney disease, or unspecified chronic kidney disease: Secondary | ICD-10-CM | POA: Diagnosis not present

## 2022-08-30 DIAGNOSIS — I129 Hypertensive chronic kidney disease with stage 1 through stage 4 chronic kidney disease, or unspecified chronic kidney disease: Secondary | ICD-10-CM | POA: Diagnosis not present

## 2022-08-30 DIAGNOSIS — E1122 Type 2 diabetes mellitus with diabetic chronic kidney disease: Secondary | ICD-10-CM | POA: Diagnosis not present

## 2022-08-30 DIAGNOSIS — R1312 Dysphagia, oropharyngeal phase: Secondary | ICD-10-CM | POA: Diagnosis not present

## 2022-08-30 DIAGNOSIS — G309 Alzheimer's disease, unspecified: Secondary | ICD-10-CM | POA: Diagnosis not present

## 2022-08-30 DIAGNOSIS — F411 Generalized anxiety disorder: Secondary | ICD-10-CM | POA: Diagnosis not present

## 2022-08-30 DIAGNOSIS — F0284 Dementia in other diseases classified elsewhere, unspecified severity, with anxiety: Secondary | ICD-10-CM | POA: Diagnosis not present

## 2022-08-30 DIAGNOSIS — N1832 Chronic kidney disease, stage 3b: Secondary | ICD-10-CM | POA: Diagnosis not present

## 2022-08-30 DIAGNOSIS — F0283 Dementia in other diseases classified elsewhere, unspecified severity, with mood disturbance: Secondary | ICD-10-CM | POA: Diagnosis not present

## 2022-08-30 DIAGNOSIS — F329 Major depressive disorder, single episode, unspecified: Secondary | ICD-10-CM | POA: Diagnosis not present

## 2022-09-06 DIAGNOSIS — E1122 Type 2 diabetes mellitus with diabetic chronic kidney disease: Secondary | ICD-10-CM | POA: Diagnosis not present

## 2022-09-06 DIAGNOSIS — R1312 Dysphagia, oropharyngeal phase: Secondary | ICD-10-CM | POA: Diagnosis not present

## 2022-09-06 DIAGNOSIS — N1832 Chronic kidney disease, stage 3b: Secondary | ICD-10-CM | POA: Diagnosis not present

## 2022-09-06 DIAGNOSIS — F0284 Dementia in other diseases classified elsewhere, unspecified severity, with anxiety: Secondary | ICD-10-CM | POA: Diagnosis not present

## 2022-09-06 DIAGNOSIS — I129 Hypertensive chronic kidney disease with stage 1 through stage 4 chronic kidney disease, or unspecified chronic kidney disease: Secondary | ICD-10-CM | POA: Diagnosis not present

## 2022-09-06 DIAGNOSIS — F0283 Dementia in other diseases classified elsewhere, unspecified severity, with mood disturbance: Secondary | ICD-10-CM | POA: Diagnosis not present

## 2022-09-06 DIAGNOSIS — F411 Generalized anxiety disorder: Secondary | ICD-10-CM | POA: Diagnosis not present

## 2022-09-06 DIAGNOSIS — F329 Major depressive disorder, single episode, unspecified: Secondary | ICD-10-CM | POA: Diagnosis not present

## 2022-09-06 DIAGNOSIS — G309 Alzheimer's disease, unspecified: Secondary | ICD-10-CM | POA: Diagnosis not present

## 2022-09-08 DIAGNOSIS — N1832 Chronic kidney disease, stage 3b: Secondary | ICD-10-CM | POA: Diagnosis not present

## 2022-09-08 DIAGNOSIS — E1122 Type 2 diabetes mellitus with diabetic chronic kidney disease: Secondary | ICD-10-CM | POA: Diagnosis not present

## 2022-09-08 DIAGNOSIS — F411 Generalized anxiety disorder: Secondary | ICD-10-CM | POA: Diagnosis not present

## 2022-09-08 DIAGNOSIS — F0284 Dementia in other diseases classified elsewhere, unspecified severity, with anxiety: Secondary | ICD-10-CM | POA: Diagnosis not present

## 2022-09-08 DIAGNOSIS — I129 Hypertensive chronic kidney disease with stage 1 through stage 4 chronic kidney disease, or unspecified chronic kidney disease: Secondary | ICD-10-CM | POA: Diagnosis not present

## 2022-09-08 DIAGNOSIS — R1312 Dysphagia, oropharyngeal phase: Secondary | ICD-10-CM | POA: Diagnosis not present

## 2022-09-08 DIAGNOSIS — G309 Alzheimer's disease, unspecified: Secondary | ICD-10-CM | POA: Diagnosis not present

## 2022-09-08 DIAGNOSIS — F329 Major depressive disorder, single episode, unspecified: Secondary | ICD-10-CM | POA: Diagnosis not present

## 2022-09-08 DIAGNOSIS — F0283 Dementia in other diseases classified elsewhere, unspecified severity, with mood disturbance: Secondary | ICD-10-CM | POA: Diagnosis not present

## 2022-09-13 DIAGNOSIS — F0283 Dementia in other diseases classified elsewhere, unspecified severity, with mood disturbance: Secondary | ICD-10-CM | POA: Diagnosis not present

## 2022-09-13 DIAGNOSIS — N1832 Chronic kidney disease, stage 3b: Secondary | ICD-10-CM | POA: Diagnosis not present

## 2022-09-13 DIAGNOSIS — R1312 Dysphagia, oropharyngeal phase: Secondary | ICD-10-CM | POA: Diagnosis not present

## 2022-09-13 DIAGNOSIS — F0284 Dementia in other diseases classified elsewhere, unspecified severity, with anxiety: Secondary | ICD-10-CM | POA: Diagnosis not present

## 2022-09-13 DIAGNOSIS — I129 Hypertensive chronic kidney disease with stage 1 through stage 4 chronic kidney disease, or unspecified chronic kidney disease: Secondary | ICD-10-CM | POA: Diagnosis not present

## 2022-09-13 DIAGNOSIS — E1122 Type 2 diabetes mellitus with diabetic chronic kidney disease: Secondary | ICD-10-CM | POA: Diagnosis not present

## 2022-09-13 DIAGNOSIS — F329 Major depressive disorder, single episode, unspecified: Secondary | ICD-10-CM | POA: Diagnosis not present

## 2022-09-13 DIAGNOSIS — G309 Alzheimer's disease, unspecified: Secondary | ICD-10-CM | POA: Diagnosis not present

## 2022-09-13 DIAGNOSIS — F411 Generalized anxiety disorder: Secondary | ICD-10-CM | POA: Diagnosis not present

## 2022-09-14 DIAGNOSIS — J301 Allergic rhinitis due to pollen: Secondary | ICD-10-CM | POA: Diagnosis not present

## 2022-09-14 DIAGNOSIS — F0284 Dementia in other diseases classified elsewhere, unspecified severity, with anxiety: Secondary | ICD-10-CM | POA: Diagnosis not present

## 2022-09-14 DIAGNOSIS — J4540 Moderate persistent asthma, uncomplicated: Secondary | ICD-10-CM | POA: Diagnosis not present

## 2022-09-14 DIAGNOSIS — N1832 Chronic kidney disease, stage 3b: Secondary | ICD-10-CM | POA: Diagnosis not present

## 2022-09-14 DIAGNOSIS — F329 Major depressive disorder, single episode, unspecified: Secondary | ICD-10-CM | POA: Diagnosis not present

## 2022-09-14 DIAGNOSIS — E559 Vitamin D deficiency, unspecified: Secondary | ICD-10-CM | POA: Diagnosis not present

## 2022-09-14 DIAGNOSIS — I129 Hypertensive chronic kidney disease with stage 1 through stage 4 chronic kidney disease, or unspecified chronic kidney disease: Secondary | ICD-10-CM | POA: Diagnosis not present

## 2022-09-14 DIAGNOSIS — G4733 Obstructive sleep apnea (adult) (pediatric): Secondary | ICD-10-CM | POA: Diagnosis not present

## 2022-09-14 DIAGNOSIS — G309 Alzheimer's disease, unspecified: Secondary | ICD-10-CM | POA: Diagnosis not present

## 2022-09-14 DIAGNOSIS — E1122 Type 2 diabetes mellitus with diabetic chronic kidney disease: Secondary | ICD-10-CM | POA: Diagnosis not present

## 2022-09-14 DIAGNOSIS — F0283 Dementia in other diseases classified elsewhere, unspecified severity, with mood disturbance: Secondary | ICD-10-CM | POA: Diagnosis not present

## 2022-09-14 DIAGNOSIS — R1312 Dysphagia, oropharyngeal phase: Secondary | ICD-10-CM | POA: Diagnosis not present

## 2022-09-14 DIAGNOSIS — F411 Generalized anxiety disorder: Secondary | ICD-10-CM | POA: Diagnosis not present

## 2022-09-15 DIAGNOSIS — F411 Generalized anxiety disorder: Secondary | ICD-10-CM | POA: Diagnosis not present

## 2022-09-15 DIAGNOSIS — F329 Major depressive disorder, single episode, unspecified: Secondary | ICD-10-CM | POA: Diagnosis not present

## 2022-09-15 DIAGNOSIS — G309 Alzheimer's disease, unspecified: Secondary | ICD-10-CM | POA: Diagnosis not present

## 2022-09-15 DIAGNOSIS — N1832 Chronic kidney disease, stage 3b: Secondary | ICD-10-CM | POA: Diagnosis not present

## 2022-09-15 DIAGNOSIS — F0284 Dementia in other diseases classified elsewhere, unspecified severity, with anxiety: Secondary | ICD-10-CM | POA: Diagnosis not present

## 2022-09-15 DIAGNOSIS — I129 Hypertensive chronic kidney disease with stage 1 through stage 4 chronic kidney disease, or unspecified chronic kidney disease: Secondary | ICD-10-CM | POA: Diagnosis not present

## 2022-09-15 DIAGNOSIS — E1122 Type 2 diabetes mellitus with diabetic chronic kidney disease: Secondary | ICD-10-CM | POA: Diagnosis not present

## 2022-09-15 DIAGNOSIS — R1312 Dysphagia, oropharyngeal phase: Secondary | ICD-10-CM | POA: Diagnosis not present

## 2022-09-15 DIAGNOSIS — F0283 Dementia in other diseases classified elsewhere, unspecified severity, with mood disturbance: Secondary | ICD-10-CM | POA: Diagnosis not present

## 2022-09-22 DIAGNOSIS — F329 Major depressive disorder, single episode, unspecified: Secondary | ICD-10-CM | POA: Diagnosis not present

## 2022-09-22 DIAGNOSIS — I129 Hypertensive chronic kidney disease with stage 1 through stage 4 chronic kidney disease, or unspecified chronic kidney disease: Secondary | ICD-10-CM | POA: Diagnosis not present

## 2022-09-22 DIAGNOSIS — F411 Generalized anxiety disorder: Secondary | ICD-10-CM | POA: Diagnosis not present

## 2022-09-22 DIAGNOSIS — E1122 Type 2 diabetes mellitus with diabetic chronic kidney disease: Secondary | ICD-10-CM | POA: Diagnosis not present

## 2022-09-22 DIAGNOSIS — F0284 Dementia in other diseases classified elsewhere, unspecified severity, with anxiety: Secondary | ICD-10-CM | POA: Diagnosis not present

## 2022-09-22 DIAGNOSIS — R1312 Dysphagia, oropharyngeal phase: Secondary | ICD-10-CM | POA: Diagnosis not present

## 2022-09-22 DIAGNOSIS — N1832 Chronic kidney disease, stage 3b: Secondary | ICD-10-CM | POA: Diagnosis not present

## 2022-09-22 DIAGNOSIS — G309 Alzheimer's disease, unspecified: Secondary | ICD-10-CM | POA: Diagnosis not present

## 2022-09-22 DIAGNOSIS — F0283 Dementia in other diseases classified elsewhere, unspecified severity, with mood disturbance: Secondary | ICD-10-CM | POA: Diagnosis not present

## 2022-09-23 DIAGNOSIS — R32 Unspecified urinary incontinence: Secondary | ICD-10-CM | POA: Diagnosis not present

## 2022-09-26 ENCOUNTER — Other Ambulatory Visit: Payer: Self-pay | Admitting: Neurology

## 2022-09-26 DIAGNOSIS — R1312 Dysphagia, oropharyngeal phase: Secondary | ICD-10-CM | POA: Diagnosis not present

## 2022-09-27 ENCOUNTER — Other Ambulatory Visit: Payer: Self-pay | Admitting: Anesthesiology

## 2022-09-27 DIAGNOSIS — F411 Generalized anxiety disorder: Secondary | ICD-10-CM | POA: Diagnosis not present

## 2022-09-27 DIAGNOSIS — F0284 Dementia in other diseases classified elsewhere, unspecified severity, with anxiety: Secondary | ICD-10-CM | POA: Diagnosis not present

## 2022-09-27 DIAGNOSIS — F329 Major depressive disorder, single episode, unspecified: Secondary | ICD-10-CM | POA: Diagnosis not present

## 2022-09-27 DIAGNOSIS — E1122 Type 2 diabetes mellitus with diabetic chronic kidney disease: Secondary | ICD-10-CM | POA: Diagnosis not present

## 2022-09-27 DIAGNOSIS — G309 Alzheimer's disease, unspecified: Secondary | ICD-10-CM | POA: Diagnosis not present

## 2022-09-27 DIAGNOSIS — I129 Hypertensive chronic kidney disease with stage 1 through stage 4 chronic kidney disease, or unspecified chronic kidney disease: Secondary | ICD-10-CM | POA: Diagnosis not present

## 2022-09-27 DIAGNOSIS — N1832 Chronic kidney disease, stage 3b: Secondary | ICD-10-CM | POA: Diagnosis not present

## 2022-09-27 DIAGNOSIS — F0283 Dementia in other diseases classified elsewhere, unspecified severity, with mood disturbance: Secondary | ICD-10-CM | POA: Diagnosis not present

## 2022-09-27 DIAGNOSIS — R1312 Dysphagia, oropharyngeal phase: Secondary | ICD-10-CM | POA: Diagnosis not present

## 2022-09-27 MED ORDER — MEMANTINE HCL 10 MG PO TABS: 10.0000 mg | ORAL_TABLET | Freq: Two times a day (BID) | ORAL | 5 refills | Status: DC

## 2022-10-03 ENCOUNTER — Telehealth: Payer: Self-pay | Admitting: Neurology

## 2022-10-03 ENCOUNTER — Other Ambulatory Visit: Payer: Self-pay | Admitting: Podiatry

## 2022-10-03 MED ORDER — MEMANTINE HCL 10 MG PO TABS: 10.0000 mg | ORAL_TABLET | Freq: Two times a day (BID) | ORAL | 3 refills | Status: DC

## 2022-10-03 NOTE — Telephone Encounter (Signed)
I received a message from the after-hours call center for a refill on memantine to be sent to [facility] in [place].  Rx sent to pharmacy of choice.

## 2022-10-26 DIAGNOSIS — J300 Vasomotor rhinitis: Secondary | ICD-10-CM | POA: Diagnosis not present

## 2022-10-26 DIAGNOSIS — H6123 Impacted cerumen, bilateral: Secondary | ICD-10-CM | POA: Diagnosis not present

## 2022-10-31 DIAGNOSIS — R351 Nocturia: Secondary | ICD-10-CM | POA: Diagnosis not present

## 2022-10-31 DIAGNOSIS — N401 Enlarged prostate with lower urinary tract symptoms: Secondary | ICD-10-CM | POA: Diagnosis not present

## 2022-10-31 DIAGNOSIS — N319 Neuromuscular dysfunction of bladder, unspecified: Secondary | ICD-10-CM | POA: Diagnosis not present

## 2022-11-21 DIAGNOSIS — Z961 Presence of intraocular lens: Secondary | ICD-10-CM | POA: Diagnosis not present

## 2022-11-21 DIAGNOSIS — H35033 Hypertensive retinopathy, bilateral: Secondary | ICD-10-CM | POA: Diagnosis not present

## 2022-11-21 DIAGNOSIS — H401133 Primary open-angle glaucoma, bilateral, severe stage: Secondary | ICD-10-CM | POA: Diagnosis not present

## 2022-11-28 ENCOUNTER — Other Ambulatory Visit: Payer: Self-pay | Admitting: Podiatry

## 2022-12-14 DIAGNOSIS — J4540 Moderate persistent asthma, uncomplicated: Secondary | ICD-10-CM | POA: Diagnosis not present

## 2022-12-14 DIAGNOSIS — J301 Allergic rhinitis due to pollen: Secondary | ICD-10-CM | POA: Diagnosis not present

## 2022-12-14 DIAGNOSIS — G4733 Obstructive sleep apnea (adult) (pediatric): Secondary | ICD-10-CM | POA: Diagnosis not present

## 2022-12-15 DIAGNOSIS — G4733 Obstructive sleep apnea (adult) (pediatric): Secondary | ICD-10-CM | POA: Diagnosis not present

## 2022-12-26 DIAGNOSIS — Z95828 Presence of other vascular implants and grafts: Secondary | ICD-10-CM | POA: Diagnosis not present

## 2022-12-26 DIAGNOSIS — R051 Acute cough: Secondary | ICD-10-CM | POA: Diagnosis not present

## 2022-12-26 DIAGNOSIS — R059 Cough, unspecified: Secondary | ICD-10-CM | POA: Diagnosis not present

## 2023-01-09 DIAGNOSIS — J4540 Moderate persistent asthma, uncomplicated: Secondary | ICD-10-CM | POA: Diagnosis not present

## 2023-01-09 DIAGNOSIS — G4733 Obstructive sleep apnea (adult) (pediatric): Secondary | ICD-10-CM | POA: Diagnosis not present

## 2023-01-09 DIAGNOSIS — J301 Allergic rhinitis due to pollen: Secondary | ICD-10-CM | POA: Diagnosis not present

## 2023-01-15 DIAGNOSIS — F0154 Vascular dementia, unspecified severity, with anxiety: Secondary | ICD-10-CM | POA: Diagnosis not present

## 2023-01-15 DIAGNOSIS — G309 Alzheimer's disease, unspecified: Secondary | ICD-10-CM | POA: Diagnosis not present

## 2023-01-15 DIAGNOSIS — F329 Major depressive disorder, single episode, unspecified: Secondary | ICD-10-CM | POA: Diagnosis not present

## 2023-01-15 DIAGNOSIS — F0284 Dementia in other diseases classified elsewhere, unspecified severity, with anxiety: Secondary | ICD-10-CM | POA: Diagnosis not present

## 2023-01-15 DIAGNOSIS — E039 Hypothyroidism, unspecified: Secondary | ICD-10-CM | POA: Diagnosis not present

## 2023-01-15 DIAGNOSIS — F411 Generalized anxiety disorder: Secondary | ICD-10-CM | POA: Diagnosis not present

## 2023-01-15 DIAGNOSIS — F0153 Vascular dementia, unspecified severity, with mood disturbance: Secondary | ICD-10-CM | POA: Diagnosis not present

## 2023-01-15 DIAGNOSIS — F0283 Dementia in other diseases classified elsewhere, unspecified severity, with mood disturbance: Secondary | ICD-10-CM | POA: Diagnosis not present

## 2023-01-15 DIAGNOSIS — M170 Bilateral primary osteoarthritis of knee: Secondary | ICD-10-CM | POA: Diagnosis not present

## 2023-01-16 DIAGNOSIS — R296 Repeated falls: Secondary | ICD-10-CM | POA: Diagnosis not present

## 2023-01-16 DIAGNOSIS — F0150 Vascular dementia without behavioral disturbance: Secondary | ICD-10-CM | POA: Diagnosis not present

## 2023-01-16 DIAGNOSIS — Z8673 Personal history of transient ischemic attack (TIA), and cerebral infarction without residual deficits: Secondary | ICD-10-CM | POA: Diagnosis not present

## 2023-01-16 DIAGNOSIS — M170 Bilateral primary osteoarthritis of knee: Secondary | ICD-10-CM | POA: Diagnosis not present

## 2023-01-16 DIAGNOSIS — N39498 Other specified urinary incontinence: Secondary | ICD-10-CM | POA: Diagnosis not present

## 2023-01-18 DIAGNOSIS — F4312 Post-traumatic stress disorder, chronic: Secondary | ICD-10-CM | POA: Diagnosis not present

## 2023-01-20 ENCOUNTER — Emergency Department (HOSPITAL_BASED_OUTPATIENT_CLINIC_OR_DEPARTMENT_OTHER)
Admission: EM | Admit: 2023-01-20 | Discharge: 2023-01-21 | Disposition: A | Discharge status: Home / Self Care | Payer: Medicare HMO | Attending: Emergency Medicine | Admitting: Emergency Medicine

## 2023-01-20 ENCOUNTER — Other Ambulatory Visit: Payer: Self-pay

## 2023-01-20 ENCOUNTER — Emergency Department (HOSPITAL_BASED_OUTPATIENT_CLINIC_OR_DEPARTMENT_OTHER): Payer: Medicare HMO | Admitting: Radiology

## 2023-01-20 ENCOUNTER — Emergency Department (HOSPITAL_BASED_OUTPATIENT_CLINIC_OR_DEPARTMENT_OTHER): Payer: Medicare HMO

## 2023-01-20 DIAGNOSIS — F411 Generalized anxiety disorder: Secondary | ICD-10-CM | POA: Diagnosis not present

## 2023-01-20 DIAGNOSIS — S20212A Contusion of left front wall of thorax, initial encounter: Secondary | ICD-10-CM | POA: Insufficient documentation

## 2023-01-20 DIAGNOSIS — M47816 Spondylosis without myelopathy or radiculopathy, lumbar region: Secondary | ICD-10-CM | POA: Diagnosis not present

## 2023-01-20 DIAGNOSIS — M545 Low back pain, unspecified: Secondary | ICD-10-CM | POA: Diagnosis present

## 2023-01-20 DIAGNOSIS — W06XXXA Fall from bed, initial encounter: Secondary | ICD-10-CM | POA: Insufficient documentation

## 2023-01-20 DIAGNOSIS — S066X0A Traumatic subarachnoid hemorrhage without loss of consciousness, initial encounter: Secondary | ICD-10-CM | POA: Diagnosis not present

## 2023-01-20 DIAGNOSIS — M549 Dorsalgia, unspecified: Secondary | ICD-10-CM | POA: Diagnosis not present

## 2023-01-20 DIAGNOSIS — G309 Alzheimer's disease, unspecified: Secondary | ICD-10-CM | POA: Diagnosis not present

## 2023-01-20 DIAGNOSIS — S39012A Strain of muscle, fascia and tendon of lower back, initial encounter: Secondary | ICD-10-CM | POA: Diagnosis not present

## 2023-01-20 DIAGNOSIS — E039 Hypothyroidism, unspecified: Secondary | ICD-10-CM | POA: Diagnosis not present

## 2023-01-20 DIAGNOSIS — F0154 Vascular dementia, unspecified severity, with anxiety: Secondary | ICD-10-CM | POA: Diagnosis not present

## 2023-01-20 DIAGNOSIS — G319 Degenerative disease of nervous system, unspecified: Secondary | ICD-10-CM | POA: Diagnosis not present

## 2023-01-20 DIAGNOSIS — F0284 Dementia in other diseases classified elsewhere, unspecified severity, with anxiety: Secondary | ICD-10-CM | POA: Diagnosis not present

## 2023-01-20 DIAGNOSIS — F0283 Dementia in other diseases classified elsewhere, unspecified severity, with mood disturbance: Secondary | ICD-10-CM | POA: Diagnosis not present

## 2023-01-20 DIAGNOSIS — I609 Nontraumatic subarachnoid hemorrhage, unspecified: Secondary | ICD-10-CM | POA: Diagnosis not present

## 2023-01-20 DIAGNOSIS — M170 Bilateral primary osteoarthritis of knee: Secondary | ICD-10-CM | POA: Diagnosis not present

## 2023-01-20 DIAGNOSIS — F329 Major depressive disorder, single episode, unspecified: Secondary | ICD-10-CM | POA: Diagnosis not present

## 2023-01-20 DIAGNOSIS — I6523 Occlusion and stenosis of bilateral carotid arteries: Secondary | ICD-10-CM | POA: Diagnosis not present

## 2023-01-20 DIAGNOSIS — M4319 Spondylolisthesis, multiple sites in spine: Secondary | ICD-10-CM | POA: Diagnosis not present

## 2023-01-20 DIAGNOSIS — S199XXA Unspecified injury of neck, initial encounter: Secondary | ICD-10-CM | POA: Diagnosis not present

## 2023-01-20 DIAGNOSIS — M47817 Spondylosis without myelopathy or radiculopathy, lumbosacral region: Secondary | ICD-10-CM | POA: Diagnosis not present

## 2023-01-20 DIAGNOSIS — R0781 Pleurodynia: Secondary | ICD-10-CM | POA: Diagnosis not present

## 2023-01-20 DIAGNOSIS — F0153 Vascular dementia, unspecified severity, with mood disturbance: Secondary | ICD-10-CM | POA: Diagnosis not present

## 2023-01-20 MED ORDER — ACETAMINOPHEN 325 MG PO TABS
650.0000 mg | ORAL_TABLET | Freq: Once | ORAL | Status: AC
Start: 2023-01-20 — End: 2023-01-20
  Administered 2023-01-20: 650 mg via ORAL
  Filled 2023-01-20: qty 2

## 2023-01-20 NOTE — Discharge Instructions (Addendum)
Follow-up with your primary care doctor within the next few days for recheck.  Return to the emergency room you have any worsening symptoms such as worsening headache, vomiting, confusion, other changes in mental status or any other worsening symptoms.

## 2023-01-20 NOTE — ED Triage Notes (Addendum)
Pt POV with family, fell out of bed earlier today, assisted back to bed by family. Seen by home health nurse today, reporting pain in back of head, advised to come in for head CT due to hx of brain bleed. No thinners. Pt is deaf and spanish is primary language

## 2023-01-20 NOTE — ED Provider Notes (Signed)
EMERGENCY DEPARTMENT AT [facility] Provider Note   CSN: [id number] Arrival date & time: 01/20/23  2440     History  Chief Complaint  Patient presents with   [name] [name] is a 77 y.o. male.  Patient is a 77 year [name] who presents with a headache after a fall.  History is obtained from the patient and the patient's family.  Reportedly, patient has had some interactions with healthcare facility regarding insurance coverage recently and he has been quite upset about this.  He had a fall this morning when he was getting out of bed and another fall later in the morning.  When asked what led to the falls, he reports that he was very angry and upset and fell related to this.  He has not had any other changes.  He has some chronic weakness which she is getting physical therapy for but no recent changes.  No worsening cough or shortness of breath.  No fevers or other recent illnesses.  He initially did not seem to have any injuries after the first fall but the second 1, he has been complaining of posterior headache.  He also has some pain in his low back and his left ribs.  He denies any other injuries.  He denies any nausea or vomiting.       Home Medications Prior to Admission medications   Medication Sig Start Date End Date Taking? Authorizing Provider  albuterol (PROVENTIL) (2.5 MG/3ML) 0.083% nebulizer solution Take 2.5 mg by nebulization every 4 (four) hours as needed for wheezing or shortness of breath.    [provider]  azelastine (ASTELIN) 0.1 % nasal spray Place 1 spray into both nostrils 2 (two) times daily.    [provider]  brimonidine (ALPHAGAN) 0.2 % ophthalmic solution Place 1 drop into both eyes 3 (three) times daily.    [provider]  CALCIUM PO Take 1 tablet by mouth daily.    [provider]  clotrimazole-betamethasone (LOTRISONE) cream APPLY 1 APPLICATION TOPICALLY DAILY 11/29/22   Standiford,  Jenelle Mages, DPM  desvenlafaxine (PRISTIQ) 100 MG 24 hr tablet Take 100 mg by mouth daily. 07/24/20   [provider]  donepezil (ARICEPT) 10 MG tablet TAKE 1 TABLET AT BEDTIME 09/28/22   Micki Riley, MD  Fluticasone-Umeclidin-Vilant (TRELEGY ELLIPTA) 100-62.5-25 MCG/ACT AEPB Inhale 1 puff into the lungs daily. SAMPLE    [provider]  gabapentin (NEURONTIN) 100 MG capsule Take 100 mg by mouth 3 (three) times daily as needed (nerve pain). 11/18/20   [provider]  ipratropium (ATROVENT) 0.06 % nasal spray Place 2 sprays into both nostrils daily. 10/27/20   [provider]  latanoprost (XALATAN) 0.005 % ophthalmic solution Place 1 drop into both eyes at bedtime. 03/20/17   [provider]  levocetirizine (XYZAL) 5 MG tablet Take 1 tablet (5 mg total) by mouth 2 (two) times daily as needed for allergies. 10/15/20   Alfonse Spruce, MD  levothyroxine (SYNTHROID) 25 MCG tablet Take 25 mcg by mouth daily. 04/01/21   [provider]  memantine (NAMENDA) 10 MG tablet Take 1 tablet (10 mg total) by mouth 2 (two) times daily. 10/03/22   Huston Foley, MD  metoprolol succinate (TOPROL-XL) 100 MG 24 hr tablet Take 100 mg by mouth at bedtime. 03/24/21   [provider]  omeprazole (PRILOSEC) 20 MG capsule Take 20 mg by mouth daily before breakfast.    [provider]  POTASSIUM  PO Take 1 tablet by mouth daily.    [provider]  rosuvastatin (CRESTOR) 20 MG tablet Take 20 mg by mouth at bedtime. 02/18/21   [provider]  triamcinolone (NASACORT) 55 MCG/ACT AERO nasal inhaler Place 1 spray into the nose daily. 04/18/21   Alfonse Spruce, MD  valACYclovir (VALTREX) 1000 MG tablet Take 1,000 mg by mouth 3 (three) times daily. Patient not taking: Reported on 02/10/2022 06/17/21   [provider]      Allergies    Patient has no known allergies.    Review of Systems   Review of Systems  Constitutional:   Negative for chills, diaphoresis, fatigue and fever.  HENT:  Negative for congestion, rhinorrhea and sneezing.   Eyes: Negative.   Respiratory:  Negative for cough, chest tightness and shortness of breath.   Cardiovascular:  Positive for chest pain (Left rib pain). Negative for leg swelling.  Gastrointestinal:  Negative for abdominal pain, blood in stool, diarrhea, nausea and vomiting.  Genitourinary:  Negative for difficulty urinating, flank pain, frequency and hematuria.  Musculoskeletal:  Positive for back pain. Negative for arthralgias.  Skin:  Negative for rash.  Neurological:  Positive for weakness (Chronic and unchanged from his baseline) and headaches. Negative for dizziness, speech difficulty and numbness.    Physical Exam Updated Vital Signs BP (!) 124/92 (BP Location: Left Arm)   Pulse 66   Temp 98.2 F (36.8 C) (Oral)   Resp 18   Ht 5\' 4"  (1.626 m)   Wt 81.6 kg   SpO2 93%   BMI 30.90 kg/m  Physical Exam Constitutional:      Appearance: He is well-developed.  HENT:     Head: Normocephalic and atraumatic.  Eyes:     Pupils: Pupils are equal, round, and reactive to light.  Neck:     Comments: No tenderness to the cervical or thoracic spine.  There is some tenderness to the lower lumbosacral spine.  No step-offs or deformities. Cardiovascular:     Rate and Rhythm: Normal rate and regular rhythm.     Heart sounds: Normal heart sounds.  Pulmonary:     Effort: Pulmonary effort is normal. No respiratory distress.     Breath sounds: Normal breath sounds. No wheezing or rales.  Chest:     Chest wall: Tenderness (Mild tenderness to the left lateral lower ribs, no crepitus or deformity, no visible external trauma noted) present.  Abdominal:     General: Bowel sounds are normal.     Palpations: Abdomen is soft.     Tenderness: There is no abdominal tenderness. There is no guarding or rebound.  Musculoskeletal:        General: Normal range of motion.     Comments: No pain  with palpation or range of motion of the extremities, including the hips  Lymphadenopathy:     Cervical: No cervical adenopathy.  Skin:    General: Skin is warm and dry.     Findings: No rash.  Neurological:     General: No focal deficit present.     Mental Status: He is alert and oriented to person, place, and time.     ED Results / Procedures / Treatments   Labs (all labs ordered are listed, but only abnormal results are displayed) Labs Reviewed - No data to display  EKG None  Radiology DG Ribs Unilateral W/Chest Left Result Date: 01/20/2023 CLINICAL DATA:  Fall and rib pain. EXAM: LEFT RIBS AND CHEST - 3+ VIEW  COMPARISON:  Chest radiograph dated 06/07/2022. FINDINGS: No obvious fracture noted. There is diffuse interstitial coarsening of the lung. No pneumothorax. Postsurgical changes of thoracic aorta with endovascular stent graft. IMPRESSION: 1. No obvious fracture. 2. Diffuse interstitial coarsening of the lung. Electronically Signed   By: Elgie Collard M.D.   On: 01/20/2023 23:40   DG Lumbar Spine Complete Result Date: 01/20/2023 CLINICAL DATA:  Fall out of bed with back pain. EXAM: LUMBAR SPINE - COMPLETE 4+ VIEW COMPARISON:  11/26/2020. FINDINGS: Diffusely decreased mineralization of the bones is noted. There is no evidence of acute lumbar spine fracture. There is bilateral spondylolysis at L5 with grade 1 anterolisthesis at L5-S1. Multilevel intervertebral disc space narrowing and degenerative endplate changes are noted, most pronounced at L5-S1. Facet arthropathy is noted in the lower lumbar spine. There is partial visualization of an thoracic aortic endograft and coils. IMPRESSION: 1. No acute fracture. 2. 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 3. Multilevel degenerative changes, most pronounced at L5-S1. Electronically Signed   By: Thornell Sartorius M.D.   On: 01/20/2023 23:22   CT Head Wo Contrast Result Date: 01/20/2023 CLINICAL DATA:  Trauma.  Fall.  EXAM: CT HEAD WITHOUT CONTRAST CT CERVICAL SPINE WITHOUT CONTRAST TECHNIQUE: Multidetector CT imaging of the head and cervical spine was performed following the standard protocol without intravenous contrast. Multiplanar CT image reconstructions of the cervical spine were also generated. RADIATION DOSE REDUCTION: This exam was performed according to the departmental dose-optimization program which includes automated exposure control, adjustment of the mA and/or kV according to patient size and/or use of iterative reconstruction technique. COMPARISON:  Head CT dated 09/02/2021. FINDINGS: CT HEAD FINDINGS Brain: There is moderate age-related atrophy and chronic microvascular ischemic changes. Minimal left frontal subarachnoid hemorrhage (26/2 and sagittal 40/5). There is no mass effect or midline shift. No extra-axial fluid collection. Vascular: No hyperdense vessel or unexpected calcification. Skull: Normal. Negative for fracture or focal lesion. Sinuses/Orbits: Mild mucoperiosteal thickening of paranasal sinuses with partial opacification of the right maxillary sinus. The mastoid air cells are clear. Other: None CT CERVICAL SPINE FINDINGS Alignment: No acute subluxation. Skull base and vertebrae: No acute fracture. Soft tissues and spinal canal: No prevertebral fluid or swelling. No visible canal hematoma. Disc levels:  No acute findings.  Degenerative changes. Upper chest: No acute findings. Other: Bilateral carotid bulb calcified plaques. Left neck vascular graft. IMPRESSION: 1. Minimal left frontal subarachnoid hemorrhage. No mass effect or midline shift. 2. Moderate age-related atrophy and chronic microvascular ischemic changes. 3. No acute/traumatic cervical spine pathology. These results were called by telephone at the time of interpretation on 01/20/2023 at 9:56 pm to provider Lakina Mcintire , who verbally acknowledged these results. Electronically Signed   By: Elgie Collard M.D.   On: 01/20/2023 21:58    CT Cervical Spine Wo Contrast Result Date: 01/20/2023 CLINICAL DATA:  Trauma.  Fall. EXAM: CT HEAD WITHOUT CONTRAST CT CERVICAL SPINE WITHOUT CONTRAST TECHNIQUE: Multidetector CT imaging of the head and cervical spine was performed following the standard protocol without intravenous contrast. Multiplanar CT image reconstructions of the cervical spine were also generated. RADIATION DOSE REDUCTION: This exam was performed according to the departmental dose-optimization program which includes automated exposure control, adjustment of the mA and/or kV according to patient size and/or use of iterative reconstruction technique. COMPARISON:  Head CT dated 09/02/2021. FINDINGS: CT HEAD FINDINGS Brain: There is moderate age-related atrophy and chronic microvascular ischemic changes. Minimal left frontal subarachnoid hemorrhage (26/2 and sagittal 40/5). There  is no mass effect or midline shift. No extra-axial fluid collection. Vascular: No hyperdense vessel or unexpected calcification. Skull: Normal. Negative for fracture or focal lesion. Sinuses/Orbits: Mild mucoperiosteal thickening of paranasal sinuses with partial opacification of the right maxillary sinus. The mastoid air cells are clear. Other: None CT CERVICAL SPINE FINDINGS Alignment: No acute subluxation. Skull base and vertebrae: No acute fracture. Soft tissues and spinal canal: No prevertebral fluid or swelling. No visible canal hematoma. Disc levels:  No acute findings.  Degenerative changes. Upper chest: No acute findings. Other: Bilateral carotid bulb calcified plaques. Left neck vascular graft. IMPRESSION: 1. Minimal left frontal subarachnoid hemorrhage. No mass effect or midline shift. 2. Moderate age-related atrophy and chronic microvascular ischemic changes. 3. No acute/traumatic cervical spine pathology. These results were called by telephone at the time of interpretation on 01/20/2023 at 9:56 pm to provider Murel Shenberger , who verbally acknowledged  these results. Electronically Signed   By: Elgie Collard M.D.   On: 01/20/2023 21:58    Procedures Procedures    Medications Ordered in ED Medications  acetaminophen (TYLENOL) tablet 650 mg (650 mg Oral Given 01/20/23 2304)    ED Course/ Medical Decision Making/ A&P                                 Medical Decision Making Amount and/or Complexity of Data Reviewed Radiology: ordered.  Risk OTC drugs.   Patient presents after what appears to be 2 mechanical falls.  He had a CT of his head and cervical spine.  There is a tiny subarachnoid hemorrhage noted on his head CT.  I spoke with Dr. Danielle Dess who reviewed the images and is not concerned with this.  He reports that patient can safely be discharged home with follow-up with his primary care doctor.  Patient is not on anticoagulants.  X-rays of the left ribs and lumbosacral spine were interpreted by me and confirmed by the radiologist to show no evident fractures.  Patient is otherwise well-appearing.  He does not have any other complaints.  He is awake and alert and appears to be at his baseline.  Findings were discussed with patient and his family.  Advised that he will need to have close follow-up with his primary care doctor.  He was advised he will need to return for any changes in mental status or other worsening symptoms and was given head injury precautions.  Final Clinical Impression(s) / ED Diagnoses Final diagnoses:  SAH (subarachnoid hemorrhage) (HCC)  Rib contusion, left, initial encounter  Back strain, initial encounter    Rx / DC Orders ED Discharge Orders     None         Rolan Bucco, MD 01/20/23 2351

## 2023-01-21 DIAGNOSIS — I609 Nontraumatic subarachnoid hemorrhage, unspecified: Secondary | ICD-10-CM | POA: Diagnosis not present

## 2023-01-24 DIAGNOSIS — F411 Generalized anxiety disorder: Secondary | ICD-10-CM | POA: Diagnosis not present

## 2023-01-24 DIAGNOSIS — F0153 Vascular dementia, unspecified severity, with mood disturbance: Secondary | ICD-10-CM | POA: Diagnosis not present

## 2023-01-24 DIAGNOSIS — F0283 Dementia in other diseases classified elsewhere, unspecified severity, with mood disturbance: Secondary | ICD-10-CM | POA: Diagnosis not present

## 2023-01-24 DIAGNOSIS — F329 Major depressive disorder, single episode, unspecified: Secondary | ICD-10-CM | POA: Diagnosis not present

## 2023-01-24 DIAGNOSIS — M170 Bilateral primary osteoarthritis of knee: Secondary | ICD-10-CM | POA: Diagnosis not present

## 2023-01-24 DIAGNOSIS — F0154 Vascular dementia, unspecified severity, with anxiety: Secondary | ICD-10-CM | POA: Diagnosis not present

## 2023-01-24 DIAGNOSIS — E039 Hypothyroidism, unspecified: Secondary | ICD-10-CM | POA: Diagnosis not present

## 2023-01-24 DIAGNOSIS — F0284 Dementia in other diseases classified elsewhere, unspecified severity, with anxiety: Secondary | ICD-10-CM | POA: Diagnosis not present

## 2023-01-24 DIAGNOSIS — G309 Alzheimer's disease, unspecified: Secondary | ICD-10-CM | POA: Diagnosis not present

## 2023-01-31 DIAGNOSIS — I10 Essential (primary) hypertension: Secondary | ICD-10-CM | POA: Diagnosis not present

## 2023-01-31 DIAGNOSIS — F0280 Dementia in other diseases classified elsewhere without behavioral disturbance: Secondary | ICD-10-CM | POA: Diagnosis not present

## 2023-01-31 DIAGNOSIS — G309 Alzheimer's disease, unspecified: Secondary | ICD-10-CM | POA: Diagnosis not present

## 2023-01-31 DIAGNOSIS — E1122 Type 2 diabetes mellitus with diabetic chronic kidney disease: Secondary | ICD-10-CM | POA: Diagnosis not present

## 2023-01-31 DIAGNOSIS — E7849 Other hyperlipidemia: Secondary | ICD-10-CM | POA: Diagnosis not present

## 2023-01-31 DIAGNOSIS — R296 Repeated falls: Secondary | ICD-10-CM | POA: Diagnosis not present

## 2023-01-31 DIAGNOSIS — F411 Generalized anxiety disorder: Secondary | ICD-10-CM | POA: Diagnosis not present

## 2023-01-31 DIAGNOSIS — I609 Nontraumatic subarachnoid hemorrhage, unspecified: Secondary | ICD-10-CM | POA: Diagnosis not present

## 2023-01-31 DIAGNOSIS — F0150 Vascular dementia without behavioral disturbance: Secondary | ICD-10-CM | POA: Diagnosis not present

## 2023-02-02 DIAGNOSIS — F329 Major depressive disorder, single episode, unspecified: Secondary | ICD-10-CM | POA: Diagnosis not present

## 2023-02-02 DIAGNOSIS — F0153 Vascular dementia, unspecified severity, with mood disturbance: Secondary | ICD-10-CM | POA: Diagnosis not present

## 2023-02-02 DIAGNOSIS — F0284 Dementia in other diseases classified elsewhere, unspecified severity, with anxiety: Secondary | ICD-10-CM | POA: Diagnosis not present

## 2023-02-02 DIAGNOSIS — G309 Alzheimer's disease, unspecified: Secondary | ICD-10-CM | POA: Diagnosis not present

## 2023-02-02 DIAGNOSIS — F0154 Vascular dementia, unspecified severity, with anxiety: Secondary | ICD-10-CM | POA: Diagnosis not present

## 2023-02-02 DIAGNOSIS — M170 Bilateral primary osteoarthritis of knee: Secondary | ICD-10-CM | POA: Diagnosis not present

## 2023-02-02 DIAGNOSIS — E039 Hypothyroidism, unspecified: Secondary | ICD-10-CM | POA: Diagnosis not present

## 2023-02-02 DIAGNOSIS — F411 Generalized anxiety disorder: Secondary | ICD-10-CM | POA: Diagnosis not present

## 2023-02-02 DIAGNOSIS — F0283 Dementia in other diseases classified elsewhere, unspecified severity, with mood disturbance: Secondary | ICD-10-CM | POA: Diagnosis not present

## 2023-02-03 DIAGNOSIS — R5383 Other fatigue: Secondary | ICD-10-CM | POA: Diagnosis not present

## 2023-02-03 DIAGNOSIS — G4733 Obstructive sleep apnea (adult) (pediatric): Secondary | ICD-10-CM | POA: Diagnosis not present

## 2023-02-06 DIAGNOSIS — F0284 Dementia in other diseases classified elsewhere, unspecified severity, with anxiety: Secondary | ICD-10-CM | POA: Diagnosis not present

## 2023-02-06 DIAGNOSIS — G309 Alzheimer's disease, unspecified: Secondary | ICD-10-CM | POA: Diagnosis not present

## 2023-02-06 DIAGNOSIS — F329 Major depressive disorder, single episode, unspecified: Secondary | ICD-10-CM | POA: Diagnosis not present

## 2023-02-06 DIAGNOSIS — M170 Bilateral primary osteoarthritis of knee: Secondary | ICD-10-CM | POA: Diagnosis not present

## 2023-02-06 DIAGNOSIS — E039 Hypothyroidism, unspecified: Secondary | ICD-10-CM | POA: Diagnosis not present

## 2023-02-06 DIAGNOSIS — F411 Generalized anxiety disorder: Secondary | ICD-10-CM | POA: Diagnosis not present

## 2023-02-06 DIAGNOSIS — F0283 Dementia in other diseases classified elsewhere, unspecified severity, with mood disturbance: Secondary | ICD-10-CM | POA: Diagnosis not present

## 2023-02-06 DIAGNOSIS — F0153 Vascular dementia, unspecified severity, with mood disturbance: Secondary | ICD-10-CM | POA: Diagnosis not present

## 2023-02-06 DIAGNOSIS — F0154 Vascular dementia, unspecified severity, with anxiety: Secondary | ICD-10-CM | POA: Diagnosis not present

## 2023-02-20 ENCOUNTER — Ambulatory Visit (INDEPENDENT_AMBULATORY_CARE_PROVIDER_SITE_OTHER): Payer: Medicare HMO

## 2023-02-20 DIAGNOSIS — G4733 Obstructive sleep apnea (adult) (pediatric): Secondary | ICD-10-CM | POA: Diagnosis not present

## 2023-02-20 DIAGNOSIS — J301 Allergic rhinitis due to pollen: Secondary | ICD-10-CM | POA: Diagnosis not present

## 2023-02-20 DIAGNOSIS — J4540 Moderate persistent asthma, uncomplicated: Secondary | ICD-10-CM | POA: Diagnosis not present

## 2023-02-20 DIAGNOSIS — G4761 Periodic limb movement disorder: Secondary | ICD-10-CM | POA: Diagnosis not present

## 2023-02-24 ENCOUNTER — Other Ambulatory Visit: Payer: Self-pay | Admitting: Neurology

## 2023-02-27 NOTE — Telephone Encounter (Signed)
Last seen on 08/25/22 Follow up scheduled on 03/22/23

## 2023-03-21 ENCOUNTER — Telehealth: Payer: Self-pay | Admitting: Neurology

## 2023-03-21 NOTE — Telephone Encounter (Signed)
 VM box full, sent text informing pt of need to reschedule 03/22/23 appt - office closed due to weather

## 2023-03-22 ENCOUNTER — Ambulatory Visit: Payer: Medicare HMO | Admitting: Neurology

## 2023-04-03 DIAGNOSIS — J301 Allergic rhinitis due to pollen: Secondary | ICD-10-CM | POA: Diagnosis not present

## 2023-04-03 DIAGNOSIS — J4540 Moderate persistent asthma, uncomplicated: Secondary | ICD-10-CM | POA: Diagnosis not present

## 2023-04-03 DIAGNOSIS — G4761 Periodic limb movement disorder: Secondary | ICD-10-CM | POA: Diagnosis not present

## 2023-04-03 DIAGNOSIS — G4733 Obstructive sleep apnea (adult) (pediatric): Secondary | ICD-10-CM | POA: Diagnosis not present

## 2023-04-21 DIAGNOSIS — H9193 Unspecified hearing loss, bilateral: Secondary | ICD-10-CM | POA: Diagnosis not present

## 2023-04-28 ENCOUNTER — Other Ambulatory Visit: Payer: Self-pay | Admitting: Podiatry

## 2023-05-05 DIAGNOSIS — I609 Nontraumatic subarachnoid hemorrhage, unspecified: Secondary | ICD-10-CM | POA: Diagnosis not present

## 2023-05-05 DIAGNOSIS — E039 Hypothyroidism, unspecified: Secondary | ICD-10-CM | POA: Diagnosis not present

## 2023-05-05 DIAGNOSIS — F0150 Vascular dementia without behavioral disturbance: Secondary | ICD-10-CM | POA: Diagnosis not present

## 2023-05-05 DIAGNOSIS — R296 Repeated falls: Secondary | ICD-10-CM | POA: Diagnosis not present

## 2023-05-05 DIAGNOSIS — G309 Alzheimer's disease, unspecified: Secondary | ICD-10-CM | POA: Diagnosis not present

## 2023-05-05 DIAGNOSIS — F411 Generalized anxiety disorder: Secondary | ICD-10-CM | POA: Diagnosis not present

## 2023-05-05 DIAGNOSIS — E1122 Type 2 diabetes mellitus with diabetic chronic kidney disease: Secondary | ICD-10-CM | POA: Diagnosis not present

## 2023-05-05 DIAGNOSIS — I10 Essential (primary) hypertension: Secondary | ICD-10-CM | POA: Diagnosis not present

## 2023-05-05 DIAGNOSIS — E7849 Other hyperlipidemia: Secondary | ICD-10-CM | POA: Diagnosis not present

## 2023-05-07 ENCOUNTER — Other Ambulatory Visit: Payer: Self-pay | Admitting: Neurology

## 2023-05-15 DIAGNOSIS — J4540 Moderate persistent asthma, uncomplicated: Secondary | ICD-10-CM | POA: Diagnosis not present

## 2023-05-15 DIAGNOSIS — G4733 Obstructive sleep apnea (adult) (pediatric): Secondary | ICD-10-CM | POA: Diagnosis not present

## 2023-05-15 DIAGNOSIS — J301 Allergic rhinitis due to pollen: Secondary | ICD-10-CM | POA: Diagnosis not present

## 2023-05-15 DIAGNOSIS — G4761 Periodic limb movement disorder: Secondary | ICD-10-CM | POA: Diagnosis not present

## 2023-05-24 ENCOUNTER — Other Ambulatory Visit: Payer: Self-pay | Admitting: Neurology

## 2023-06-21 ENCOUNTER — Other Ambulatory Visit: Payer: Self-pay | Admitting: Internal Medicine

## 2023-06-21 ENCOUNTER — Ambulatory Visit
Admission: RE | Admit: 2023-06-21 | Discharge: 2023-06-21 | Disposition: A | Discharge status: Home / Self Care | Source: Ambulatory Visit | Attending: Internal Medicine | Admitting: Internal Medicine

## 2023-06-21 DIAGNOSIS — R051 Acute cough: Secondary | ICD-10-CM | POA: Diagnosis not present

## 2023-06-21 DIAGNOSIS — I950 Idiopathic hypotension: Secondary | ICD-10-CM | POA: Diagnosis not present

## 2023-06-21 DIAGNOSIS — M1612 Unilateral primary osteoarthritis, left hip: Secondary | ICD-10-CM | POA: Diagnosis not present

## 2023-06-21 DIAGNOSIS — M25552 Pain in left hip: Secondary | ICD-10-CM | POA: Diagnosis not present

## 2023-06-21 DIAGNOSIS — W19XXXA Unspecified fall, initial encounter: Secondary | ICD-10-CM | POA: Diagnosis not present

## 2023-06-21 DIAGNOSIS — S0990XA Unspecified injury of head, initial encounter: Secondary | ICD-10-CM | POA: Diagnosis not present

## 2023-06-21 DIAGNOSIS — Z789 Other specified health status: Secondary | ICD-10-CM | POA: Diagnosis not present

## 2023-06-21 DIAGNOSIS — N39498 Other specified urinary incontinence: Secondary | ICD-10-CM | POA: Diagnosis not present

## 2023-06-28 DIAGNOSIS — I10 Essential (primary) hypertension: Secondary | ICD-10-CM | POA: Diagnosis not present

## 2023-06-28 DIAGNOSIS — I2510 Atherosclerotic heart disease of native coronary artery without angina pectoris: Secondary | ICD-10-CM | POA: Diagnosis not present

## 2023-06-28 DIAGNOSIS — N1831 Chronic kidney disease, stage 3a: Secondary | ICD-10-CM | POA: Diagnosis not present

## 2023-06-28 DIAGNOSIS — I129 Hypertensive chronic kidney disease with stage 1 through stage 4 chronic kidney disease, or unspecified chronic kidney disease: Secondary | ICD-10-CM | POA: Diagnosis not present

## 2023-06-29 DIAGNOSIS — G894 Chronic pain syndrome: Secondary | ICD-10-CM | POA: Diagnosis not present

## 2023-06-29 DIAGNOSIS — Z789 Other specified health status: Secondary | ICD-10-CM | POA: Diagnosis not present

## 2023-06-29 DIAGNOSIS — J449 Chronic obstructive pulmonary disease, unspecified: Secondary | ICD-10-CM | POA: Diagnosis not present

## 2023-06-29 DIAGNOSIS — F329 Major depressive disorder, single episode, unspecified: Secondary | ICD-10-CM | POA: Diagnosis not present

## 2023-06-29 DIAGNOSIS — I950 Idiopathic hypotension: Secondary | ICD-10-CM | POA: Diagnosis not present

## 2023-06-29 DIAGNOSIS — N39498 Other specified urinary incontinence: Secondary | ICD-10-CM | POA: Diagnosis not present

## 2023-06-29 DIAGNOSIS — J309 Allergic rhinitis, unspecified: Secondary | ICD-10-CM | POA: Diagnosis not present

## 2023-06-29 DIAGNOSIS — R296 Repeated falls: Secondary | ICD-10-CM | POA: Diagnosis not present

## 2023-06-29 DIAGNOSIS — J988 Other specified respiratory disorders: Secondary | ICD-10-CM | POA: Diagnosis not present

## 2023-07-01 DIAGNOSIS — I129 Hypertensive chronic kidney disease with stage 1 through stage 4 chronic kidney disease, or unspecified chronic kidney disease: Secondary | ICD-10-CM | POA: Diagnosis not present

## 2023-07-01 DIAGNOSIS — N1832 Chronic kidney disease, stage 3b: Secondary | ICD-10-CM | POA: Diagnosis not present

## 2023-07-01 DIAGNOSIS — F411 Generalized anxiety disorder: Secondary | ICD-10-CM | POA: Diagnosis not present

## 2023-07-01 DIAGNOSIS — I2510 Atherosclerotic heart disease of native coronary artery without angina pectoris: Secondary | ICD-10-CM | POA: Diagnosis not present

## 2023-07-01 DIAGNOSIS — F0150 Vascular dementia without behavioral disturbance: Secondary | ICD-10-CM | POA: Diagnosis not present

## 2023-07-01 DIAGNOSIS — N1831 Chronic kidney disease, stage 3a: Secondary | ICD-10-CM | POA: Diagnosis not present

## 2023-07-01 DIAGNOSIS — I10 Essential (primary) hypertension: Secondary | ICD-10-CM | POA: Diagnosis not present

## 2023-07-01 DIAGNOSIS — G309 Alzheimer's disease, unspecified: Secondary | ICD-10-CM | POA: Diagnosis not present

## 2023-07-04 DIAGNOSIS — N3941 Urge incontinence: Secondary | ICD-10-CM | POA: Diagnosis not present

## 2023-07-06 DIAGNOSIS — G4761 Periodic limb movement disorder: Secondary | ICD-10-CM | POA: Diagnosis not present

## 2023-07-06 DIAGNOSIS — G4733 Obstructive sleep apnea (adult) (pediatric): Secondary | ICD-10-CM | POA: Diagnosis not present

## 2023-07-06 DIAGNOSIS — J301 Allergic rhinitis due to pollen: Secondary | ICD-10-CM | POA: Diagnosis not present

## 2023-07-06 DIAGNOSIS — J4540 Moderate persistent asthma, uncomplicated: Secondary | ICD-10-CM | POA: Diagnosis not present

## 2023-07-17 ENCOUNTER — Ambulatory Visit (INDEPENDENT_AMBULATORY_CARE_PROVIDER_SITE_OTHER): Admitting: Podiatry

## 2023-07-17 ENCOUNTER — Encounter: Payer: Self-pay | Admitting: Podiatry

## 2023-07-17 DIAGNOSIS — M79675 Pain in left toe(s): Secondary | ICD-10-CM | POA: Diagnosis not present

## 2023-07-17 DIAGNOSIS — Z95828 Presence of other vascular implants and grafts: Secondary | ICD-10-CM | POA: Diagnosis not present

## 2023-07-17 DIAGNOSIS — M79674 Pain in right toe(s): Secondary | ICD-10-CM

## 2023-07-17 DIAGNOSIS — J189 Pneumonia, unspecified organism: Secondary | ICD-10-CM | POA: Diagnosis not present

## 2023-07-17 DIAGNOSIS — B351 Tinea unguium: Secondary | ICD-10-CM | POA: Diagnosis not present

## 2023-07-17 DIAGNOSIS — R051 Acute cough: Secondary | ICD-10-CM | POA: Diagnosis not present

## 2023-07-17 DIAGNOSIS — Z862 Personal history of diseases of the blood and blood-forming organs and certain disorders involving the immune mechanism: Secondary | ICD-10-CM

## 2023-07-17 DIAGNOSIS — R059 Cough, unspecified: Secondary | ICD-10-CM | POA: Diagnosis not present

## 2023-07-17 NOTE — Progress Notes (Unsigned)
  Subjective:  Patient ID: [name] [name], male    DOB: 1945/12/31,   MRN: [id number]  No chief complaint on file.   78 y.o. male presents for concern of thickened elongated and painful toenails. Relates he is unable to trim himself. Is on blood thinners due to aortic dissection . Denies any other pedal complaints. Denies n/v/f/c.   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 CFT <3 seconds. Normal hair growth on digits. No edema.  Skin. No lacerations or abrasions bilateral feet. Nails 1-5 are thickened discolored and elongated with subungual debris. Third and fourth insterspaces macerated bilateral- improved.  Musculoskeletal: MMT 5/5 bilateral lower extremities in DF, PF, Inversion and Eversion. Deceased ROM in DF of ankle joint.  Neurological: Sensation intact to light touch.   Assessment:   1. Pain due to onychomycosis of toenails of both feet   2. Hx of blood coagulation disorder       Plan:  Patient was evaluated and treated and all questions answered. -Discussed and educated patient on foot care, especially with  regards to the vascular, neurological and musculoskeletal systems.  -Discussed supportive shoes at all times and checking feet regularly.  -Mechanically debrided all nails 1-5 bilateral using sterile nail nipper and filed with dremel without incident  -Answered all patient questions  -Patient to return  in 3 months for at risk  foot care -Patient advised to call the office if any problems or questions arise in the meantime.   Jennefer Moats, DPM

## 2023-07-24 ENCOUNTER — Telehealth: Payer: Self-pay | Admitting: Neurology

## 2023-07-24 NOTE — Telephone Encounter (Signed)
 Called and LVM to see if pt wanted to come in sooner. I put on hold an appt for today 6/23 at 2pm

## 2023-07-24 NOTE — Telephone Encounter (Signed)
 Pt's son has called back to inform pt will not be able to come today due to increased blood sugar readings.

## 2023-07-27 DIAGNOSIS — I10 Essential (primary) hypertension: Secondary | ICD-10-CM | POA: Diagnosis not present

## 2023-07-27 DIAGNOSIS — I2510 Atherosclerotic heart disease of native coronary artery without angina pectoris: Secondary | ICD-10-CM | POA: Diagnosis not present

## 2023-07-27 DIAGNOSIS — N39498 Other specified urinary incontinence: Secondary | ICD-10-CM | POA: Diagnosis not present

## 2023-07-27 DIAGNOSIS — E1165 Type 2 diabetes mellitus with hyperglycemia: Secondary | ICD-10-CM | POA: Diagnosis not present

## 2023-07-27 DIAGNOSIS — J988 Other specified respiratory disorders: Secondary | ICD-10-CM | POA: Diagnosis not present

## 2023-07-27 DIAGNOSIS — F0280 Dementia in other diseases classified elsewhere without behavioral disturbance: Secondary | ICD-10-CM | POA: Diagnosis not present

## 2023-07-27 DIAGNOSIS — N1831 Chronic kidney disease, stage 3a: Secondary | ICD-10-CM | POA: Diagnosis not present

## 2023-07-27 DIAGNOSIS — G309 Alzheimer's disease, unspecified: Secondary | ICD-10-CM | POA: Diagnosis not present

## 2023-07-27 DIAGNOSIS — I129 Hypertensive chronic kidney disease with stage 1 through stage 4 chronic kidney disease, or unspecified chronic kidney disease: Secondary | ICD-10-CM | POA: Diagnosis not present

## 2023-07-31 DIAGNOSIS — N1832 Chronic kidney disease, stage 3b: Secondary | ICD-10-CM | POA: Diagnosis not present

## 2023-07-31 DIAGNOSIS — G309 Alzheimer's disease, unspecified: Secondary | ICD-10-CM | POA: Diagnosis not present

## 2023-07-31 DIAGNOSIS — N1831 Chronic kidney disease, stage 3a: Secondary | ICD-10-CM | POA: Diagnosis not present

## 2023-07-31 DIAGNOSIS — I129 Hypertensive chronic kidney disease with stage 1 through stage 4 chronic kidney disease, or unspecified chronic kidney disease: Secondary | ICD-10-CM | POA: Diagnosis not present

## 2023-07-31 DIAGNOSIS — I2510 Atherosclerotic heart disease of native coronary artery without angina pectoris: Secondary | ICD-10-CM | POA: Diagnosis not present

## 2023-07-31 DIAGNOSIS — F0150 Vascular dementia without behavioral disturbance: Secondary | ICD-10-CM | POA: Diagnosis not present

## 2023-07-31 DIAGNOSIS — F411 Generalized anxiety disorder: Secondary | ICD-10-CM | POA: Diagnosis not present

## 2023-07-31 DIAGNOSIS — I10 Essential (primary) hypertension: Secondary | ICD-10-CM | POA: Diagnosis not present

## 2023-08-01 ENCOUNTER — Other Ambulatory Visit: Payer: Self-pay | Admitting: Neurology

## 2023-08-01 ENCOUNTER — Other Ambulatory Visit: Payer: Self-pay | Admitting: Podiatry

## 2023-08-03 NOTE — Telephone Encounter (Signed)
 Last seen on 08/25/22 Follow up scheduled on 10/05/23    Dispensed Days Supply Quantity Provider Pharmacy  donepezil  10 mg tablet 07/31/2023 90 90 tablet Pahwani, Rinka R, MD [facility] Mail D...      Rx denied, 90 day supply sent in on 07/31/23

## 2023-08-09 ENCOUNTER — Other Ambulatory Visit (HOSPITAL_BASED_OUTPATIENT_CLINIC_OR_DEPARTMENT_OTHER): Payer: Self-pay | Admitting: Internal Medicine

## 2023-08-09 DIAGNOSIS — R059 Cough, unspecified: Secondary | ICD-10-CM

## 2023-08-10 ENCOUNTER — Encounter: Payer: Self-pay | Admitting: Neurology

## 2023-08-10 ENCOUNTER — Ambulatory Visit (INDEPENDENT_AMBULATORY_CARE_PROVIDER_SITE_OTHER): Admitting: Neurology

## 2023-08-10 VITALS — BP 116/71 | HR 89 | Ht 62.0 in | Wt 175.0 lb

## 2023-08-10 DIAGNOSIS — F0280 Dementia in other diseases classified elsewhere without behavioral disturbance: Secondary | ICD-10-CM

## 2023-08-10 DIAGNOSIS — Z8673 Personal history of transient ischemic attack (TIA), and cerebral infarction without residual deficits: Secondary | ICD-10-CM

## 2023-08-10 DIAGNOSIS — F0150 Vascular dementia without behavioral disturbance: Secondary | ICD-10-CM | POA: Diagnosis not present

## 2023-08-10 DIAGNOSIS — G309 Alzheimer's disease, unspecified: Secondary | ICD-10-CM

## 2023-08-10 NOTE — Patient Instructions (Signed)
 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  Recommend strict control of hypertension with blood pressure goal below 140/90 and aggressive risk factor modification.  Continue ongoing physical occupational and speech therapy.  Patient encouraged to increase participation in cognitively challenging activities like solving crossword puzzles, playing bridge and sudoku.  We also discussed memory compensation strategies.  Continue Aricept    10 mg daily  .  And namenda  10 mg twice daily as he seems to have shown cognitive improvement..  Return for follow-up in future only as necessary or call earlier if necessary

## 2023-08-10 NOTE — Progress Notes (Signed)
 [facility] [address] [place]. [place] [address] [phone]       OFFICE FOLLOW-UP NOTE  Mr. Darrol Brandenburg Date of Birth:  [birth year] Medical Record Number:  [id number]   HPI: Patient is visit 02/10/2022 Mr Borjon  is a 78 year old hispanic male seen today for initial office follow up visit after hospital admission for ICH in August 2023. He is accompanied by his wife, son and grandson and history is obtained from them and I have personally reviewed electronic medical records and pertinent available imaging films in PACS.Tyan Dy is a 78 y.o. male with PMH significant for history of ascending aortic dissection, chronic back pain and L5 fracture, degenerative disc disease, hypertension, hard of hearing and underlying dementia who had a fall on 08/14/2021.  He was seen by the PCP with no focal deficit on exam.  He had a screening outpatient CT head without contrast which demonstrated a small 10 mm acute intraparenchymal hemorrhage at the junction of the right lentiform nucleus and the retrolenticular white matter.  He takes a baby aspirin at home and took it today.  He is not on any anticoagulation.  No prior history of stroke or ICH.  He does have hypertension the family monitors his blood pressures about twice a day and notes that it ranges from 120 systolics to 140s.He does not smoke, no alcohol intake.  He was admitted to the intensive care unit and blood pressure was tightly controlled.  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  2D echo showed ejection fraction of 60 to 65% with grade 1 diastolic dysfunction.  Bioprosthetic aortic valve.  LDL cholesterol was 47 mg percent.  Hemoglobin A1c was 4.5.  His aspirin was held.  Does not have much physical deficits except for his cognitive impairment.  He was discharged home to the care of  his family with home physical occupational and speech therapy which she is still getting.  Family feels his blood pressure is still not controlled and he is cheats on his food and gets up in the middle of the night and splurges on food from the refrigerator food that he is not supposed to eat.  He is able to walk with a wheeled walker and is careful and has not had major falls.  He needs 24-hour supervision and care.  He has not been started on medications like Aricept  and Namenda  for his dementia. Update 08/25/2022 : He returns for follow-up after last visit 6 months ago.  He is accompanied by his wife, son and grandson.  The family feels patient is tolerating Aricept  well without side effects.  He may have obtained slight improvement.  He is more participating in conversation with families and is able to recognize most family members.  Continues to live at home with his wife.  He does not talk as much.  His blood pressure at home is quite well-controlled though it is elevated in office today at 167/74.  He is ambulating with a cane.  He has had no falls or injuries.  He has not exhibited any unsafe behavior, delusions or hallucinations.  He does have a wheeled walker but prefers not to use it.  He does have 24-hour supervision at home.  No new neurological complaints.  On MMSE testing today scored 17/30 which is slightly improved compared to last visit. Update 08/10/2023 : He returns  for follow-up after last visit a year ago.  He is accompanied by his wife, son and grandson.  Patient is doing much better now.  He is tolerating Namenda  10 mg twice daily which seems to have helped.  He is also reduced the dose of his PTSD medication Pristiq which may have also helped.  He is able to speak better now and participates in conversations.  He is also physically more active and is able to get up and walk with a walker and likes to spend time outdoors in the backyard with the dogs.He had a couple of falls last year and was  seen in the ER in December 2024 but fortunately did not have any fracture.  His primary care physician treated him recently with antibiotics for pneumonia and is recovered well from that.  On Mini-Mental status exam today he scored 21/30 which is improved from 17/30 from a year ago.  He is not exhibiting any hallucinations, delusions, agitation or unsafe behavior. 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   Hypertension    SOB (shortness of breath) on exertion     Social History:  Social History   Socioeconomic History   Marital status: Married    Spouse name: Not on file   Number of children: Not on file   Years of education: Not on file   Highest education level: Not on file  Occupational History   Not on file  Tobacco Use   Smoking status: Former    Current packs/day: 0.00    Types: Cigarettes    Quit date: 03/24/2009    Years since quitting: 14.3   Smokeless tobacco: Never  Substance and Sexual Activity   Alcohol use: No   Drug use: Not on file   Sexual activity: Not on file  Other Topics Concern   Not on file  Social History Narrative   ** Merged History Encounter **       Social Drivers of Corporate investment banker Strain: Not on file  Food Insecurity: Not on file  Transportation Needs: No Transportation Needs (12/02/2020)   Received from [facility]   PRAPARE - Transportation    Lack of Transportation (Medical): No    Lack of  Transportation (Non-Medical): No  Physical Activity: Not on file  Stress: Not on file  Social Connections: Not on file  Intimate Partner Violence: Not on file    Medications:   Current Outpatient Medications on File Prior to Visit  Medication Sig Dispense Refill   CALCIUM  PO Take 1 tablet by mouth daily.     clotrimazole -betamethasone  (LOTRISONE ) cream APPLY 1 APPLICATION TOPICALLY DAILY 30 g 0   desvenlafaxine (PRISTIQ) 100 MG 24 hr tablet Take 100 mg by mouth daily.     donepezil  (ARICEPT ) 10 MG tablet TAKE 1 TABLET (10 MG TOTAL) BY MOUTH AT BEDTIME. START WITH A 1/2 TABLET DAILY FOR 4 WEEKS AND THEN INCREASE AS TOLERATED TO  1 TABLET 30 tablet 3   Fluticasone-Umeclidin-Vilant (TRELEGY ELLIPTA) 100-62.5-25 MCG/ACT AEPB Inhale 1 puff into the lungs daily. SAMPLE     gabapentin  (NEURONTIN ) 100 MG capsule Take 100 mg by mouth 3 (three) times daily as needed (nerve pain).     latanoprost  (XALATAN ) 0.005 % ophthalmic solution Place 1 drop into both eyes at bedtime.     levocetirizine (XYZAL ) 5 MG tablet Take 1 tablet (5 mg total) by mouth 2 (two) times daily as needed for allergies. 60 tablet 5   levothyroxine  (SYNTHROID ) 25 MCG tablet Take 25 mcg by mouth daily.     memantine  (NAMENDA ) 10 MG tablet TAKE 1 TABLET TWICE DAILY 180 tablet 3   metoprolol succinate (TOPROL-XL) 100 MG 24 hr tablet Take 100 mg by mouth at bedtime.     omeprazole (PRILOSEC) 20 MG capsule Take 20 mg by mouth daily before breakfast.     rosuvastatin  (CRESTOR ) 20 MG tablet Take 20 mg by mouth at bedtime.     triamcinolone  (NASACORT ) 55 MCG/ACT AERO nasal inhaler Place 1 spray into the nose daily. 49.5 g 1   POTASSIUM PO Take 1 tablet by mouth daily. (Patient not taking: Reported on 08/10/2023)     valACYclovir (VALTREX) 1000 MG tablet Take 1,000 mg by mouth 3 (three) times daily. (Patient not taking: Reported on 08/10/2023)     No current facility-administered medications on file prior to visit.    Allergies:  No Known  Allergies  Physical Exam General: well developed, well nourished elderly hispanic male, seated, in no evident distress Head: head normocephalic and atraumatic.  Neck: supple with no carotid or supraclavicular bruits Cardiovascular: regular rate and rhythm, no murmurs Musculoskeletal: no deformity Skin:  no rash/petichiae Vascular:  Normal pulses all extremities Vitals:   08/10/23 1506  BP: 116/71  Pulse: 89   Neurologic Exam Mental Status: Awake and fully alert. Oriented to place and time. Recent and remote memory poor Attention span, concentration and fund of knowledge diminished. Mood and affect appropriate. MMSE 17/30 Cranial Nerves: Fundoscopic exam reveals sharp disc margins. Pupils equal, briskly reactive to light. Extraocular movements full without nystagmus. Visual fields full to confrontation. Hearing intact. Facial sensation intact. Face, tongue, palate moves normally and symmetrically.  Motor: Normal bulk and tone. Normal strength in all tested extremity muscles. Sensory.: intact to touch ,pinprick .position and vibratory sensation.  Coordination: Rapid alternating movements normal in all extremities. Finger-to-nose and heel-to-shin performed accurately bilaterally. Gait and Station: Arises from chair without difficulty. Stance is normal. Gait  wide based slightly ataxic while turning. Tandem gait not assessed Reflexes: 1+ and symmetric. Toes downgoing.   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  He is tolerating Aricept  and Namenda  well with slight improvement on cognitive testing.    PLAN: I had a long discussion with the patient, his wife and son regarding his remote right basal ganglia hemorrhage likely related to hypertension as well as his baseline mixed dementia and answered questions.  Recommend strict control of hypertension with blood pressure goal below 140/90 and aggressive risk factor modification.  Continue ongoing physical occupational and speech therapy.  Patient encouraged to increase participation in cognitively challenging activities like solving crossword puzzles, playing bridge and sudoku.  We also discussed memory compensation strategies.  Continue Aricept    10 mg daily and namenda  10 mg twice daily as he seems to have shown cognitive improvement..  Return for follow-up in future only as necessary or call earlier if necessary Greater than 50% of time during this prolonged 40  minute visit was spent on counseling,explanation of diagnosis, planning of further management, discussion with patient and family and coordination of care Eather Popp, MD Note: This document was prepared with digital dictation and possible smart phrase technology. Any transcriptional errors that result from this process are unintentional

## 2023-08-16 ENCOUNTER — Ambulatory Visit (INDEPENDENT_AMBULATORY_CARE_PROVIDER_SITE_OTHER): Payer: 59 | Admitting: Otolaryngology

## 2023-08-16 ENCOUNTER — Encounter (INDEPENDENT_AMBULATORY_CARE_PROVIDER_SITE_OTHER): Payer: Self-pay | Admitting: Otolaryngology

## 2023-08-16 VITALS — BP 136/81 | HR 65

## 2023-08-16 DIAGNOSIS — R0982 Postnasal drip: Secondary | ICD-10-CM

## 2023-08-16 DIAGNOSIS — H6123 Impacted cerumen, bilateral: Secondary | ICD-10-CM

## 2023-08-16 DIAGNOSIS — H903 Sensorineural hearing loss, bilateral: Secondary | ICD-10-CM

## 2023-08-17 ENCOUNTER — Ambulatory Visit (INDEPENDENT_AMBULATORY_CARE_PROVIDER_SITE_OTHER)
Admission: RE | Admit: 2023-08-17 | Discharge: 2023-08-17 | Disposition: A | Discharge status: Home / Self Care | Payer: 59 | Source: Ambulatory Visit | Attending: Internal Medicine | Admitting: Internal Medicine

## 2023-08-17 DIAGNOSIS — R059 Cough, unspecified: Secondary | ICD-10-CM

## 2023-08-17 DIAGNOSIS — R053 Chronic cough: Secondary | ICD-10-CM

## 2023-08-17 DIAGNOSIS — J984 Other disorders of lung: Secondary | ICD-10-CM | POA: Diagnosis not present

## 2023-08-17 LAB — I-STAT CREATININE (MANUAL ENTRY): Creatinine, Ser: 1.6 — AB (ref 0.50–1.10)

## 2023-08-17 MED ORDER — IOHEXOL 300 MG/ML  SOLN
100.0000 mL | Freq: Once | INTRAMUSCULAR | Status: AC | PRN
  Administered 2023-08-17: 50 mL via INTRAVENOUS

## 2023-08-18 DIAGNOSIS — H6123 Impacted cerumen, bilateral: Secondary | ICD-10-CM | POA: Insufficient documentation

## 2023-08-18 DIAGNOSIS — R0982 Postnasal drip: Secondary | ICD-10-CM | POA: Insufficient documentation

## 2023-08-18 DIAGNOSIS — H903 Sensorineural hearing loss, bilateral: Secondary | ICD-10-CM | POA: Insufficient documentation

## 2023-08-18 NOTE — Progress Notes (Signed)
 Patient ID: [name] [name], male   DOB: 1945-05-27, 78 y.o.   MRN: [id number]  Follow-up: Chronic postnasal drainage, hearing loss  HPI: The patient is a 78 year old male who returns today for his follow-up evaluation.  The patient was previously seen for bilateral severe sensorineural hearing loss and chronic postnasal drainage.  He was previously fitted with bilateral hearing aids.  He was also treated with Atrovent nasal spray.  The patient returns today reporting no significant change in his hearing.  He wears his hearing aids daily.  Currently he denies any facial pain, fever, or visual change.  Exam: General: Communicates without difficulty, well nourished, no acute distress. Head: Normocephalic, no evidence injury, no tenderness, facial buttresses intact without stepoff. Face/sinus: No tenderness to palpation and percussion. Facial movement is normal and symmetric. Eyes: PERRL, EOMI. No scleral icterus, conjunctivae clear. Neuro: CN II exam reveals vision grossly intact.  No nystagmus at any point of gaze. Ears: Auricles well formed without lesions.  Bilateral cerumen impaction.  Nose: External evaluation reveals normal support and skin without lesions.  Dorsum is intact.  Anterior rhinoscopy reveals congested mucosa over anterior aspect of inferior turbinates and intact septum.  No purulence noted. Oral:  Oral cavity and oropharynx are intact, symmetric, without erythema or edema.  Mucosa is moist without lesions. Neck: Full range of motion without pain.  There is no significant lymphadenopathy.  No masses palpable.  Thyroid  bed within normal limits to palpation.  Parotid glands and submandibular glands equal bilaterally without mass.  Trachea is midline. Neuro:  CN 2-12 grossly intact.   Procedure: Bilateral cerumen disimpaction Anesthesia: None Description: Under the operating microscope, the cerumen is carefully removed with a combination of cerumen currette, alligator forceps, and suction  catheters.  After the cerumen is removed, the TMs are noted to be normal.  No mass, erythema, or lesions. The patient tolerated the procedure well.    Assessment: 1.  Bilateral cerumen impaction.  After the disimpaction procedure, both tympanic membranes and middle ear spaces are noted to be normal. 2.  Bilateral severe sensorineural hearing loss. 3.  Chronic postnasal drainage  Plan: 1.  Otomicroscopy with bilateral cerumen disimpaction. 2.  The physical exam findings are reviewed with the patient. 3.  Continue the use of his hearing aids. 4.  Atrovent nasal spray as needed to treat his chronic postnasal drainage. 5.  The patient will return for reevaluation in 1 year.

## 2023-08-26 DIAGNOSIS — I10 Essential (primary) hypertension: Secondary | ICD-10-CM | POA: Diagnosis not present

## 2023-08-26 DIAGNOSIS — I129 Hypertensive chronic kidney disease with stage 1 through stage 4 chronic kidney disease, or unspecified chronic kidney disease: Secondary | ICD-10-CM | POA: Diagnosis not present

## 2023-08-26 DIAGNOSIS — N1831 Chronic kidney disease, stage 3a: Secondary | ICD-10-CM | POA: Diagnosis not present

## 2023-08-26 DIAGNOSIS — I2510 Atherosclerotic heart disease of native coronary artery without angina pectoris: Secondary | ICD-10-CM | POA: Diagnosis not present

## 2023-08-31 DIAGNOSIS — N1832 Chronic kidney disease, stage 3b: Secondary | ICD-10-CM | POA: Diagnosis not present

## 2023-08-31 DIAGNOSIS — I10 Essential (primary) hypertension: Secondary | ICD-10-CM | POA: Diagnosis not present

## 2023-08-31 DIAGNOSIS — I129 Hypertensive chronic kidney disease with stage 1 through stage 4 chronic kidney disease, or unspecified chronic kidney disease: Secondary | ICD-10-CM | POA: Diagnosis not present

## 2023-08-31 DIAGNOSIS — N1831 Chronic kidney disease, stage 3a: Secondary | ICD-10-CM | POA: Diagnosis not present

## 2023-08-31 DIAGNOSIS — F0150 Vascular dementia without behavioral disturbance: Secondary | ICD-10-CM | POA: Diagnosis not present

## 2023-08-31 DIAGNOSIS — F411 Generalized anxiety disorder: Secondary | ICD-10-CM | POA: Diagnosis not present

## 2023-08-31 DIAGNOSIS — G309 Alzheimer's disease, unspecified: Secondary | ICD-10-CM | POA: Diagnosis not present

## 2023-08-31 DIAGNOSIS — I2510 Atherosclerotic heart disease of native coronary artery without angina pectoris: Secondary | ICD-10-CM | POA: Diagnosis not present

## 2023-09-22 DIAGNOSIS — L293 Anogenital pruritus, unspecified: Secondary | ICD-10-CM | POA: Diagnosis not present

## 2023-09-22 DIAGNOSIS — J22 Unspecified acute lower respiratory infection: Secondary | ICD-10-CM | POA: Diagnosis not present

## 2023-09-22 DIAGNOSIS — R059 Cough, unspecified: Secondary | ICD-10-CM | POA: Diagnosis not present

## 2023-09-22 DIAGNOSIS — R051 Acute cough: Secondary | ICD-10-CM | POA: Diagnosis not present

## 2023-09-22 DIAGNOSIS — I878 Other specified disorders of veins: Secondary | ICD-10-CM | POA: Diagnosis not present

## 2023-09-22 DIAGNOSIS — R918 Other nonspecific abnormal finding of lung field: Secondary | ICD-10-CM | POA: Diagnosis not present

## 2023-09-25 DIAGNOSIS — I10 Essential (primary) hypertension: Secondary | ICD-10-CM | POA: Diagnosis not present

## 2023-09-25 DIAGNOSIS — I129 Hypertensive chronic kidney disease with stage 1 through stage 4 chronic kidney disease, or unspecified chronic kidney disease: Secondary | ICD-10-CM | POA: Diagnosis not present

## 2023-09-25 DIAGNOSIS — N1831 Chronic kidney disease, stage 3a: Secondary | ICD-10-CM | POA: Diagnosis not present

## 2023-09-25 DIAGNOSIS — I2510 Atherosclerotic heart disease of native coronary artery without angina pectoris: Secondary | ICD-10-CM | POA: Diagnosis not present

## 2023-09-27 DIAGNOSIS — I10 Essential (primary) hypertension: Secondary | ICD-10-CM | POA: Diagnosis not present

## 2023-09-27 DIAGNOSIS — K5900 Constipation, unspecified: Secondary | ICD-10-CM | POA: Diagnosis not present

## 2023-09-27 DIAGNOSIS — J449 Chronic obstructive pulmonary disease, unspecified: Secondary | ICD-10-CM | POA: Diagnosis not present

## 2023-09-27 DIAGNOSIS — G629 Polyneuropathy, unspecified: Secondary | ICD-10-CM | POA: Diagnosis not present

## 2023-09-27 DIAGNOSIS — R399 Unspecified symptoms and signs involving the genitourinary system: Secondary | ICD-10-CM | POA: Diagnosis not present

## 2023-09-27 DIAGNOSIS — F329 Major depressive disorder, single episode, unspecified: Secondary | ICD-10-CM | POA: Diagnosis not present

## 2023-09-27 DIAGNOSIS — F0280 Dementia in other diseases classified elsewhere without behavioral disturbance: Secondary | ICD-10-CM | POA: Diagnosis not present

## 2023-09-27 DIAGNOSIS — J22 Unspecified acute lower respiratory infection: Secondary | ICD-10-CM | POA: Diagnosis not present

## 2023-09-27 DIAGNOSIS — E1122 Type 2 diabetes mellitus with diabetic chronic kidney disease: Secondary | ICD-10-CM | POA: Diagnosis not present

## 2023-09-27 DIAGNOSIS — E039 Hypothyroidism, unspecified: Secondary | ICD-10-CM | POA: Diagnosis not present

## 2023-09-28 DIAGNOSIS — H401133 Primary open-angle glaucoma, bilateral, severe stage: Secondary | ICD-10-CM | POA: Diagnosis not present

## 2023-10-01 DIAGNOSIS — I2510 Atherosclerotic heart disease of native coronary artery without angina pectoris: Secondary | ICD-10-CM | POA: Diagnosis not present

## 2023-10-01 DIAGNOSIS — F411 Generalized anxiety disorder: Secondary | ICD-10-CM | POA: Diagnosis not present

## 2023-10-01 DIAGNOSIS — N1831 Chronic kidney disease, stage 3a: Secondary | ICD-10-CM | POA: Diagnosis not present

## 2023-10-01 DIAGNOSIS — I129 Hypertensive chronic kidney disease with stage 1 through stage 4 chronic kidney disease, or unspecified chronic kidney disease: Secondary | ICD-10-CM | POA: Diagnosis not present

## 2023-10-01 DIAGNOSIS — I10 Essential (primary) hypertension: Secondary | ICD-10-CM | POA: Diagnosis not present

## 2023-10-01 DIAGNOSIS — N1832 Chronic kidney disease, stage 3b: Secondary | ICD-10-CM | POA: Diagnosis not present

## 2023-10-01 DIAGNOSIS — G309 Alzheimer's disease, unspecified: Secondary | ICD-10-CM | POA: Diagnosis not present

## 2023-10-01 DIAGNOSIS — F0150 Vascular dementia without behavioral disturbance: Secondary | ICD-10-CM | POA: Diagnosis not present

## 2023-10-03 DIAGNOSIS — R0981 Nasal congestion: Secondary | ICD-10-CM | POA: Diagnosis not present

## 2023-10-03 DIAGNOSIS — R509 Fever, unspecified: Secondary | ICD-10-CM | POA: Diagnosis not present

## 2023-10-03 DIAGNOSIS — R070 Pain in throat: Secondary | ICD-10-CM | POA: Diagnosis not present

## 2023-10-03 DIAGNOSIS — R051 Acute cough: Secondary | ICD-10-CM | POA: Diagnosis not present

## 2023-10-03 DIAGNOSIS — J189 Pneumonia, unspecified organism: Secondary | ICD-10-CM | POA: Diagnosis not present

## 2023-10-05 ENCOUNTER — Ambulatory Visit: Admitting: Neurology

## 2023-10-16 ENCOUNTER — Encounter: Payer: Self-pay | Admitting: Podiatry

## 2023-10-16 ENCOUNTER — Ambulatory Visit (INDEPENDENT_AMBULATORY_CARE_PROVIDER_SITE_OTHER): Payer: 59 | Admitting: Podiatry

## 2023-10-16 DIAGNOSIS — M79675 Pain in left toe(s): Secondary | ICD-10-CM

## 2023-10-16 DIAGNOSIS — M79674 Pain in right toe(s): Secondary | ICD-10-CM | POA: Diagnosis not present

## 2023-10-16 DIAGNOSIS — Z862 Personal history of diseases of the blood and blood-forming organs and certain disorders involving the immune mechanism: Secondary | ICD-10-CM | POA: Diagnosis not present

## 2023-10-16 DIAGNOSIS — B351 Tinea unguium: Secondary | ICD-10-CM | POA: Diagnosis not present

## 2023-10-16 MED ORDER — KETOCONAZOLE 2 % EX CREA: 1.0000 | TOPICAL_CREAM | Freq: Every day | CUTANEOUS | 2 refills | Status: AC

## 2023-10-16 NOTE — Progress Notes (Signed)
  Subjective:  Patient ID: [name] [name], male    DOB: Feb 19, 1945,   MRN: [id number]  Chief Complaint  Patient presents with   Nail Problem    He needs his toenails clipped and some type of cream for his athletes feet.    78 y.o. male presents for concern of thickened elongated and painful toenails. Relates he is unable to trim himself. Is on blood thinners due to aortic dissection . Denies any other pedal complaints. Denies n/v/f/c.   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 CFT <3 seconds. Normal hair growth on digits. No edema.  Skin. No lacerations or abrasions bilateral feet. Nails 1-5 are thickened discolored and elongated with subungual debris. Third and fourth insterspaces macerated bilateral- improved.  Musculoskeletal: MMT 5/5 bilateral lower extremities in DF, PF, Inversion and Eversion. Deceased ROM in DF of ankle joint.  Neurological: Sensation intact to light touch.   Assessment:   1. Pain due to onychomycosis of toenails of both feet   2. Hx of blood coagulation disorder       Plan:  Patient was evaluated and treated and all questions answered. -Discussed and educated patient on foot care, especially with  regards to the vascular, neurological and musculoskeletal systems.  -Discussed supportive shoes at all times and checking feet regularly.  -Mechanically debrided all nails 1-5 bilateral using sterile nail nipper and filed  with dremel without incident  -Answered all patient questions  -Ketoconazole  sent at request.  -Patient to return  in 3 months for at risk foot care -Patient advised to call the office if any problems or questions arise in the meantime.   [name], DPM

## 2023-10-18 ENCOUNTER — Ambulatory Visit: Payer: 59 | Admitting: Podiatry

## 2023-10-19 DIAGNOSIS — R053 Chronic cough: Secondary | ICD-10-CM | POA: Diagnosis not present

## 2023-10-19 DIAGNOSIS — J411 Mucopurulent chronic bronchitis: Secondary | ICD-10-CM | POA: Diagnosis not present

## 2023-10-24 DIAGNOSIS — G4733 Obstructive sleep apnea (adult) (pediatric): Secondary | ICD-10-CM | POA: Diagnosis not present

## 2023-10-24 DIAGNOSIS — J301 Allergic rhinitis due to pollen: Secondary | ICD-10-CM | POA: Diagnosis not present

## 2023-10-24 DIAGNOSIS — G4761 Periodic limb movement disorder: Secondary | ICD-10-CM | POA: Diagnosis not present

## 2023-10-24 DIAGNOSIS — J4540 Moderate persistent asthma, uncomplicated: Secondary | ICD-10-CM | POA: Diagnosis not present

## 2023-10-25 DIAGNOSIS — N1831 Chronic kidney disease, stage 3a: Secondary | ICD-10-CM | POA: Diagnosis not present

## 2023-10-25 DIAGNOSIS — I2510 Atherosclerotic heart disease of native coronary artery without angina pectoris: Secondary | ICD-10-CM | POA: Diagnosis not present

## 2023-10-25 DIAGNOSIS — R053 Chronic cough: Secondary | ICD-10-CM | POA: Diagnosis not present

## 2023-10-25 DIAGNOSIS — I10 Essential (primary) hypertension: Secondary | ICD-10-CM | POA: Diagnosis not present

## 2023-10-25 DIAGNOSIS — I129 Hypertensive chronic kidney disease with stage 1 through stage 4 chronic kidney disease, or unspecified chronic kidney disease: Secondary | ICD-10-CM | POA: Diagnosis not present

## 2023-10-26 ENCOUNTER — Other Ambulatory Visit: Payer: Self-pay | Admitting: Neurology

## 2023-10-30 DIAGNOSIS — J439 Emphysema, unspecified: Secondary | ICD-10-CM | POA: Diagnosis not present

## 2023-10-30 DIAGNOSIS — J4540 Moderate persistent asthma, uncomplicated: Secondary | ICD-10-CM | POA: Diagnosis not present

## 2023-10-30 DIAGNOSIS — I7123 Aneurysm of the descending thoracic aorta, without rupture: Secondary | ICD-10-CM | POA: Diagnosis not present

## 2023-10-31 DIAGNOSIS — F411 Generalized anxiety disorder: Secondary | ICD-10-CM | POA: Diagnosis not present

## 2023-10-31 DIAGNOSIS — N1831 Chronic kidney disease, stage 3a: Secondary | ICD-10-CM | POA: Diagnosis not present

## 2023-10-31 DIAGNOSIS — I129 Hypertensive chronic kidney disease with stage 1 through stage 4 chronic kidney disease, or unspecified chronic kidney disease: Secondary | ICD-10-CM | POA: Diagnosis not present

## 2023-10-31 DIAGNOSIS — G309 Alzheimer's disease, unspecified: Secondary | ICD-10-CM | POA: Diagnosis not present

## 2023-10-31 DIAGNOSIS — F0150 Vascular dementia without behavioral disturbance: Secondary | ICD-10-CM | POA: Diagnosis not present

## 2023-10-31 DIAGNOSIS — I10 Essential (primary) hypertension: Secondary | ICD-10-CM | POA: Diagnosis not present

## 2023-10-31 DIAGNOSIS — J4540 Moderate persistent asthma, uncomplicated: Secondary | ICD-10-CM | POA: Diagnosis not present

## 2023-10-31 DIAGNOSIS — N1832 Chronic kidney disease, stage 3b: Secondary | ICD-10-CM | POA: Diagnosis not present

## 2023-10-31 DIAGNOSIS — I2510 Atherosclerotic heart disease of native coronary artery without angina pectoris: Secondary | ICD-10-CM | POA: Diagnosis not present

## 2023-11-07 ENCOUNTER — Telehealth: Payer: Self-pay

## 2023-11-07 DIAGNOSIS — J4489 Other specified chronic obstructive pulmonary disease: Secondary | ICD-10-CM

## 2023-11-08 DIAGNOSIS — R1310 Dysphagia, unspecified: Secondary | ICD-10-CM | POA: Diagnosis not present

## 2023-11-13 DIAGNOSIS — N319 Neuromuscular dysfunction of bladder, unspecified: Secondary | ICD-10-CM | POA: Diagnosis not present

## 2023-11-13 DIAGNOSIS — Z125 Encounter for screening for malignant neoplasm of prostate: Secondary | ICD-10-CM | POA: Diagnosis not present

## 2023-11-13 DIAGNOSIS — N401 Enlarged prostate with lower urinary tract symptoms: Secondary | ICD-10-CM | POA: Diagnosis not present

## 2023-11-13 DIAGNOSIS — R351 Nocturia: Secondary | ICD-10-CM | POA: Diagnosis not present

## 2023-11-14 ENCOUNTER — Telehealth: Payer: Self-pay

## 2023-11-14 DIAGNOSIS — F0150 Vascular dementia without behavioral disturbance: Secondary | ICD-10-CM | POA: Diagnosis not present

## 2023-11-14 DIAGNOSIS — F0280 Dementia in other diseases classified elsewhere without behavioral disturbance: Secondary | ICD-10-CM | POA: Diagnosis not present

## 2023-11-14 DIAGNOSIS — Z0000 Encounter for general adult medical examination without abnormal findings: Secondary | ICD-10-CM | POA: Diagnosis not present

## 2023-11-14 DIAGNOSIS — I10 Essential (primary) hypertension: Secondary | ICD-10-CM | POA: Diagnosis not present

## 2023-11-14 DIAGNOSIS — E1122 Type 2 diabetes mellitus with diabetic chronic kidney disease: Secondary | ICD-10-CM | POA: Diagnosis not present

## 2023-11-14 DIAGNOSIS — I609 Nontraumatic subarachnoid hemorrhage, unspecified: Secondary | ICD-10-CM | POA: Diagnosis not present

## 2023-11-14 DIAGNOSIS — E7849 Other hyperlipidemia: Secondary | ICD-10-CM | POA: Diagnosis not present

## 2023-11-14 DIAGNOSIS — N1831 Chronic kidney disease, stage 3a: Secondary | ICD-10-CM | POA: Diagnosis not present

## 2023-11-14 DIAGNOSIS — G309 Alzheimer's disease, unspecified: Secondary | ICD-10-CM | POA: Diagnosis not present

## 2023-11-14 DIAGNOSIS — E039 Hypothyroidism, unspecified: Secondary | ICD-10-CM | POA: Diagnosis not present

## 2023-11-14 NOTE — Progress Notes (Unsigned)
 Complex Care Management Note Care Guide Note  11/14/2023 Name: [name] [name] MRN: [id number] DOB: 1945-07-17   Complex Care Management Outreach Attempts: An unsuccessful telephone outreach was attempted today to offer the patient information about available complex care management services.  Follow Up Plan:  Additional outreach attempts will be made to offer the patient complex care management information and services.   Encounter Outcome:  No Answer  .Debbe Fuse [facility], [facility] Guide  Direct Dial: [phone]  Fax [phone]

## 2023-11-15 NOTE — Progress Notes (Signed)
 Complex Care Management Note  Care Guide Note 11/15/2023 Name: [name] MRN: [id number] DOB: 1945-04-13  [name] [name] is a 78 y.o. year old male who sees Pahwani, Velna SAUNDERS, MD for primary care. I reached out to [name] Bonner Duos by phone today to offer complex care management services.  Mr. Lafoe was given information about Complex Care Management services today including:   The Complex Care Management services include support from the care team which includes your Nurse Care Manager, Clinical Social Worker, or Pharmacist.  The Complex Care Management team is here to help remove barriers to the health concerns and goals most important to you. Complex Care Management services are voluntary, and the patient may decline or stop services at any time by request to their care team member.   Complex Care Management Consent Status: Patient agreed to services and verbal consent obtained.   Follow up plan:  Telephone appointment with complex care management team member scheduled for:  11/16/23  Encounter Outcome:  Patient Scheduled  Harlene Satterfield  [facility]  [facility], [facility] Guide  Direct Dial: [phone]  Fax [phone]

## 2023-11-16 ENCOUNTER — Telehealth: Payer: Self-pay

## 2023-11-23 ENCOUNTER — Telehealth: Payer: Self-pay

## 2023-11-23 ENCOUNTER — Telehealth: Payer: Self-pay | Admitting: *Deleted

## 2023-11-23 NOTE — Telephone Encounter (Signed)
 Received 2nd fax from [facility] for ketoconazole  2% cream

## 2023-11-23 NOTE — Telephone Encounter (Signed)
 Received faxed refill request from [facility] pharmacy for clotrimazole -betamethasone1%/0.05% cream.

## 2023-11-23 NOTE — Progress Notes (Unsigned)
 Complex Care Management Care Guide Note  11/23/2023 Name: Suhayb Anzalone MRN: [id number] DOB: 1945/08/13  Dannielle Bonner Saulters is a 78 y.o. year old male who is a primary care patient of Pahwani, Velna SAUNDERS, MD and is actively engaged with the care management team. I reached out to Dannielle Bonner Duos by phone today to assist with re-scheduling  with the RN Case Manager.  Follow up plan: Unsuccessful telephone outreach attempt made. A HIPAA compliant phone message was left for the patient providing contact information and requesting a return call.  Harlene Satterfield  [facility]  [facility], [facility] Guide  Direct Dial: [phone]  Fax [phone]

## 2023-11-24 NOTE — Progress Notes (Signed)
 Complex Care Management Care Guide Note  11/24/2023 Name: Dezmin Kittelson MRN: [id number] DOB: 1945-06-04  Dannielle Bonner Loh is a 78 y.o. year old male who is a primary care patient of Pahwani, Velna SAUNDERS, MD and is actively engaged with the care management team. I reached out to Dannielle Bonner Duos by phone today to assist with re-scheduling  with the BSW.  Follow up plan: Unsuccessful telephone outreach attempt made. A HIPAA compliant phone message was left for the patient providing contact information and requesting a return call. No further outreach attempts will be made at this time. We have been unable to contact the patient to reschedule for complex care management services.  Harlene Satterfield  [facility]  [facility], [facility] Guide  Direct Dial: [phone]  Fax [phone]

## 2023-11-29 ENCOUNTER — Telehealth: Payer: 59 | Admitting: Licensed Clinical Social Worker

## 2023-12-16 DIAGNOSIS — E785 Hyperlipidemia, unspecified: Secondary | ICD-10-CM | POA: Diagnosis not present

## 2023-12-16 DIAGNOSIS — J302 Other seasonal allergic rhinitis: Secondary | ICD-10-CM | POA: Diagnosis not present

## 2023-12-16 DIAGNOSIS — F0150 Vascular dementia without behavioral disturbance: Secondary | ICD-10-CM | POA: Diagnosis not present

## 2023-12-16 DIAGNOSIS — F4310 Post-traumatic stress disorder, unspecified: Secondary | ICD-10-CM | POA: Diagnosis not present

## 2023-12-16 DIAGNOSIS — G309 Alzheimer's disease, unspecified: Secondary | ICD-10-CM | POA: Diagnosis not present

## 2023-12-16 DIAGNOSIS — Z7951 Long term (current) use of inhaled steroids: Secondary | ICD-10-CM | POA: Diagnosis not present

## 2023-12-16 DIAGNOSIS — E039 Hypothyroidism, unspecified: Secondary | ICD-10-CM | POA: Diagnosis not present

## 2023-12-16 DIAGNOSIS — Z5982 Transportation insecurity: Secondary | ICD-10-CM | POA: Diagnosis not present

## 2023-12-16 DIAGNOSIS — I10 Essential (primary) hypertension: Secondary | ICD-10-CM | POA: Diagnosis not present

## 2023-12-16 DIAGNOSIS — H409 Unspecified glaucoma: Secondary | ICD-10-CM | POA: Diagnosis not present

## 2023-12-16 DIAGNOSIS — K219 Gastro-esophageal reflux disease without esophagitis: Secondary | ICD-10-CM | POA: Diagnosis not present

## 2023-12-16 DIAGNOSIS — Z8249 Family history of ischemic heart disease and other diseases of the circulatory system: Secondary | ICD-10-CM | POA: Diagnosis not present

## 2023-12-16 DIAGNOSIS — N400 Enlarged prostate without lower urinary tract symptoms: Secondary | ICD-10-CM | POA: Diagnosis not present

## 2023-12-16 DIAGNOSIS — M40209 Unspecified kyphosis, site unspecified: Secondary | ICD-10-CM | POA: Diagnosis not present

## 2023-12-16 DIAGNOSIS — J449 Chronic obstructive pulmonary disease, unspecified: Secondary | ICD-10-CM | POA: Diagnosis not present

## 2023-12-16 DIAGNOSIS — R269 Unspecified abnormalities of gait and mobility: Secondary | ICD-10-CM | POA: Diagnosis not present

## 2023-12-16 DIAGNOSIS — Z9989 Dependence on other enabling machines and devices: Secondary | ICD-10-CM | POA: Diagnosis not present

## 2023-12-16 DIAGNOSIS — N3941 Urge incontinence: Secondary | ICD-10-CM | POA: Diagnosis not present

## 2023-12-16 DIAGNOSIS — I700 Atherosclerosis of aorta: Secondary | ICD-10-CM | POA: Diagnosis not present

## 2023-12-16 DIAGNOSIS — G510 Bell's palsy: Secondary | ICD-10-CM | POA: Diagnosis not present

## 2024-01-08 ENCOUNTER — Ambulatory Visit (INDEPENDENT_AMBULATORY_CARE_PROVIDER_SITE_OTHER): Payer: 59 | Admitting: Otolaryngology

## 2024-01-15 ENCOUNTER — Ambulatory Visit: Payer: 59 | Admitting: Podiatry

## 2024-01-15 DIAGNOSIS — Z91199 Patient's noncompliance with other medical treatment and regimen due to unspecified reason: Secondary | ICD-10-CM

## 2024-01-15 NOTE — Progress Notes (Signed)
 No show

## 2024-02-29 ENCOUNTER — Ambulatory Visit: Payer: 59

## 2024-02-29 ENCOUNTER — Other Ambulatory Visit: Payer: Self-pay | Admitting: Neurology

## 2024-02-29 ENCOUNTER — Other Ambulatory Visit: Payer: Self-pay

## 2024-02-29 VITALS — BP 126/68 | HR 51 | Temp 97.7°F | Ht 67.0 in | Wt 176.0 lb

## 2024-02-29 DIAGNOSIS — R053 Chronic cough: Secondary | ICD-10-CM

## 2024-02-29 DIAGNOSIS — Z87891 Personal history of nicotine dependence: Secondary | ICD-10-CM | POA: Diagnosis not present

## 2024-02-29 DIAGNOSIS — R1310 Dysphagia, unspecified: Secondary | ICD-10-CM

## 2024-02-29 DIAGNOSIS — J310 Chronic rhinitis: Secondary | ICD-10-CM

## 2024-02-29 DIAGNOSIS — R918 Other nonspecific abnormal finding of lung field: Secondary | ICD-10-CM | POA: Diagnosis not present

## 2024-02-29 DIAGNOSIS — R059 Cough, unspecified: Secondary | ICD-10-CM

## 2024-02-29 DIAGNOSIS — R9389 Abnormal findings on diagnostic imaging of other specified body structures: Secondary | ICD-10-CM

## 2024-02-29 MED ORDER — IPRATROPIUM-ALBUTEROL 0.5-2.5 (3) MG/3ML IN SOLN: 3.0000 mL | Freq: Four times a day (QID) | RESPIRATORY_TRACT | 1 refills | Status: AC | PRN

## 2024-02-29 MED ORDER — FLUTICASONE PROPIONATE 50 MCG/ACT NA SUSP: 2.0000 | Freq: Every day | NASAL | 2 refills | Status: AC

## 2024-02-29 MED ORDER — BENZONATATE 200 MG PO CAPS: 200.0000 mg | ORAL_CAPSULE | Freq: Three times a day (TID) | ORAL | 1 refills | Status: AC | PRN

## 2024-02-29 MED ORDER — SODIUM CHLORIDE 3 % IN NEBU: INHALATION_SOLUTION | Freq: Two times a day (BID) | RESPIRATORY_TRACT | 12 refills | Status: AC

## 2024-02-29 NOTE — Patient Instructions (Addendum)
 VISIT SUMMARY: During your visit, we addressed your persistent cough, chronic rhinitis, difficulty swallowing, and COPD. We discussed your symptoms, recent evaluations, and treatment plans to help manage your conditions.  YOUR PLAN: CHRONIC COUGH AND AIRWAY CLEARANCE DYSFUNCTION: You have had a persistent cough for eight months, initially diagnosed as pneumonia. Recent evaluations suggest it is due to airway clearance dysfunction rather than pneumonia. -We ordered a chest x-ray to assess your current lung status. -We prescribed nebulizer treatments with saline  to thin secretions. -We ordered a flutter device to aid in airway clearance.   CHRONIC RHINITIS: You have a history of chronic rhinitis, which causes a runny nose. -Continue using the prescribed nasal spray for rhinitis management.  DYSPHAGIA: You have difficulty swallowing, which may lead to aspiration and contribute to your airway symptoms. -Continue using thickened liquids to reduce the risk of aspiration.  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 This contributes to your cough and secretions as well -Continue using nebulizer treatments to manage your COPD symptoms. - PFT scheduled   Duonebs- followed hypertonic saline on nebulizer followed by flutter device - do this 2-3 times a day for airway clearance   Contains text generated by [employer].

## 2024-02-29 NOTE — Progress Notes (Signed)
 "  New Patient Pulmonology Office Visit   Subjective:  Patient ID: Jerri Hargadon, male    DOB: Feb 05, 1945  MRN: [id number]  Referred by: Vernon Velna SAUNDERS, MD  CC:  Chief Complaint  Patient presents with   Consult    Previously has bronchitis and PNA. Consistent cough, stable.     HPI Daiel Strohecker is a 79 y.o. male who is here to establish care for chronic cough  Discussed the use of AI scribe software for clinical note transcription with the patient, who gave verbal consent to proceed.  History of Present Illness Mickel Schreur is a 79 year old male with a history of pneumonia and dysphagia who presents with a persistent cough. He is accompanied by his family.  He has been experiencing a persistent cough for the past eight months. Initially, he was evaluated and diagnosed with pneumonia after presenting with vomiting and undergoing x-rays. Despite completing a course of antibiotics, the cough persisted, and subsequent evaluations revealed dysphagia. He has hx of Alzheimer's and vascular dementia. He continues to experience difficulty swallowing, contributing to his cough.  He has a history of smoking, having quit 40 years ago after smoking since the age of 16.  He has a history of rhinitis, for which he underwent a procedure to freeze the inside of his nose (? Nasal endoscopic intervention), providing temporary relief. He uses nasal spray as needed but has run out of those. No known allergies, as confirmed by testing three years ago.     ROS Review of symptoms negative except mentioned above   Allergies: Patient has no known allergies. 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 No pertinent family history. Social History   Socioeconomic History   Marital status: Married    Spouse name: Not on file   Number of children: Not on file   Years of education: Not on file   Highest education level: Not on file  Occupational History   Not on file  Tobacco Use   Smoking status: Former    Current packs/day: 0.00    Types: Cigarettes    Quit date: 03/24/2009    Years since quitting: 14.9   Smokeless tobacco: Never  Substance and Sexual Activity   Alcohol use: No   Drug use: Not on file   Sexual activity: Not on file  Other Topics Concern   Not on file  Social History Narrative   ** Merged History Encounter **       Social Drivers of Health   Tobacco Use: Medium Risk (02/29/2024)   Patient History    Smoking Tobacco Use: Former    Smokeless Tobacco Use: Never    Passive Exposure: Not on Actuary Strain: Not on file  Food Insecurity: Not on  file  Transportation Needs: Not on file  Physical Activity: Not on file  Stress: Not on file  Social Connections: Not on file  Intimate Partner Violence: Not on file  Depression (EYV7-0): Not on file  Alcohol Screen: Not on file  Housing: Unknown (09/28/2023)   Received from [facility]   Epic    At any time in the past 12 months, were you homeless or living in a shelter (including now)?: No    Number of Times Moved in the Last Year: Not on file    Unable to Pay for Housing in the Last Year: Not on file  Utilities: Not on file  Health Literacy: Not on file          Objective:  BP 126/68   Pulse (!) 51   Temp 97.7 F (36.5 C)   Ht 5' 7 (1.702 m) Comment: pt stated  Wt 176 lb (79.8 kg)   SpO2 95% Comment: ra  BMI 27.57 kg/m    Physical Exam Constitutional:      General: He is not in acute distress.    Appearance: Normal appearance.  HENT:     Mouth/Throat:     Mouth: Mucous membranes are moist.  Cardiovascular:     Rate and Rhythm: Normal rate.  Pulmonary:     Effort: No respiratory distress.     Breath sounds: No wheezing or rales.  Musculoskeletal:     Right lower leg: No edema.     Left lower leg: No edema.  Skin:    General: Skin is warm.  Neurological:     Mental Status: He is alert and oriented to person, place, and time.  Psychiatric:        Mood and Affect: Mood normal.     Diagnostic Review:    Pft     No data to display              Chest CT (08/2023): Aortic stent repair; minimal emphysema; no significant pulmonary parenchymal abnormality; Minimal, bland appearing scarring in the left-greater-than-right lung bases with elevation of the left hemidiaphragm.   (Independently interpreted)  Chest xray 06/2023: Chronic elevation of left hemidiaphragm.   08/2021 ECHO 1. Left ventricular ejection fraction, by estimation, is 60 to 65%. The  left ventricle has normal function. The left ventricle has no regional  wall motion abnormalities. There is mild left ventricular hypertrophy.  Left ventricular diastolic parameters  are consistent with Grade I diastolic dysfunction (impaired relaxation).   2. Right ventricular systolic function is normal. The right ventricular  size is normal. Tricuspid regurgitation signal is inadequate for assessing  PA pressure.   3. 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 Mean  gradient 10 mmHg, no significant stenosis. No significant regurgitation  noted.   4. Ascending aorta has been repaired/replaced. There is mild dilatation  of the aortic root, measuring  40 mm.   5. The mitral valve is normal in structure. No evidence of mitral valve  regurgitation. No evidence of mitral stenosis.   6. The inferior vena cava is normal in size with greater than 50%  respiratory variability, suggesting right atrial pressure of 3 mmHg.   Results Radiology       Assessment & Plan:   Assessment & Plan Chronic cough I explained to the patient in detail that chronic cough could be a manifestation of chronic sinusitis with post nasal drip, cough variant asthma, Nonasthmatic eosinophilic bronchitis, gastroesophageal reflux, laryngopharyngeal reflux, parenchymal lung disease like  fibrosis, pulmonary edema from congestive heart failure, habitual cough or lung cancer. Based on his history and symptoms, appears to be driven by chronic dysphagia Has tested negative for allergies in the past Discussed airways clearance methods- pt will start using duonebs followed by hypertonic saline followed by flutter device 2-4 times a day Aspiration precautions discussed Rhonchi on exam- will get chest xray today Likely has some degree of COPD as well- given prior smoking- PFT and duonebs ordered Orders:   fluticasone  (FLONASE ) 50 MCG/ACT nasal spray; Place 2 sprays into both nostrils daily.   ipratropium-albuterol  (DUONEB) 0.5-2.5 (3) MG/3ML SOLN; Take 3 mLs by nebulization every 6 (six) hours as needed.   sodium chloride  HYPERTONIC 3 % nebulizer solution; Take by nebulization in the morning and at bedtime. Use duonebs (through nebulizer) before using this   Flutter valve; Future   Pulmonary function test; Future   DG Chest 2 View; Future   benzonatate  (TESSALON ) 200 MG capsule; Take 1 capsule (200 mg total) by mouth 3 (three) times daily as needed for cough.  Abnormal CT of the chest Minimal atelectasis/basal scarring - non specific Chronic elevation of left hemidiaphragm    Chronic rhinitis Restart flonase  Orders:   fluticasone  (FLONASE ) 50 MCG/ACT nasal spray; Place  2 sprays into both nostrils daily.   Pulmonary function test; Future  Dysphagia, unspecified type Likely related to dementia and neurologic issues Airways clearance methods discussed as above Orders:   fluticasone  (FLONASE ) 50 MCG/ACT nasal spray; Place 2 sprays into both nostrils daily.   ipratropium-albuterol  (DUONEB) 0.5-2.5 (3) MG/3ML SOLN; Take 3 mLs by nebulization every 6 (six) hours as needed.   sodium chloride  HYPERTONIC 3 % nebulizer solution; Take by nebulization in the morning and at bedtime. Use duonebs (through nebulizer) before using this   Flutter valve; Future   DG Chest 2 View; Future    Thank you for the opportunity to take part in the care of Donatello Kleve   Return in about 3 months (around 05/29/2024).   Jeany Seville Pleas, MD [facility] Office: [phone]  I personally spent a total of 60 minutes in the care of the patient today including getting/reviewing separately obtained history, performing a medically appropriate exam/evaluation, counseling and educating, placing orders,providing usage directions and referring and communicating with other health care professionals and documenting in chart.      [1]  Current Outpatient Medications:    benzonatate  (TESSALON ) 200 MG capsule, Take 1 capsule (200 mg total) by mouth 3 (three) times daily as needed for cough., Disp: 20 capsule, Rfl: 1   CALCIUM  PO, Take 1 tablet by mouth daily., Disp: , Rfl:    clotrimazole -betamethasone  (LOTRISONE ) cream, APPLY 1 APPLICATION TOPICALLY DAILY, Disp: 30 g, Rfl: 0   desvenlafaxine (PRISTIQ) 100 MG 24 hr tablet, Take 100 mg by mouth daily., Disp: , Rfl:    donepezil  (ARICEPT ) 10 MG tablet, TAKE 1 TABLET (10 MG TOTAL) BY MOUTH AT BEDTIME. START WITH A 1/2 TABLET DAILY FOR 4 WEEKS AND THEN INCREASE AS TOLERATED TO 1 TABLET, Disp: 30 tablet, Rfl: 3   fluticasone  (FLONASE ) 50 MCG/ACT nasal spray, Place 2 sprays into both nostrils daily., Disp: 16 g, Rfl: 2    Fluticasone -Umeclidin-Vilant (TRELEGY ELLIPTA) 100-62.5-25 MCG/ACT AEPB, Inhale 1 puff into the lungs daily. SAMPLE, Disp: , Rfl:    gabapentin  (NEURONTIN ) 100 MG capsule, Take 100 mg by mouth 3 (three) times daily as needed (nerve pain)., Disp: , Rfl:    ipratropium-albuterol  (DUONEB) 0.5-2.5 (3) MG/3ML  SOLN, Take 3 mLs by nebulization every 6 (six) hours as needed., Disp: 360 mL, Rfl: 1   ketoconazole  (NIZORAL ) 2 % cream, Apply 1 Application topically daily., Disp: 60 g, Rfl: 2   latanoprost  (XALATAN ) 0.005 % ophthalmic solution, Place 1 drop into both eyes at bedtime., Disp: , Rfl:    levocetirizine (XYZAL ) 5 MG tablet, Take 1 tablet (5 mg total) by mouth 2 (two) times daily as needed for allergies., Disp: 60 tablet, Rfl: 5   levothyroxine  (SYNTHROID ) 25 MCG tablet, Take 25 mcg by mouth daily., Disp: , Rfl:    losartan (COZAAR) 50 MG tablet, 50 mg., Disp: , Rfl:    memantine  (NAMENDA ) 10 MG tablet, TAKE 1 TABLET TWICE DAILY, Disp: 180 tablet, Rfl: 1   metoprolol succinate (TOPROL-XL) 100 MG 24 hr tablet, Take 100 mg by mouth at bedtime., Disp: , Rfl:    omeprazole (PRILOSEC) 20 MG capsule, Take 20 mg by mouth daily before breakfast., Disp: , Rfl:    POTASSIUM PO, Take 1 tablet by mouth daily., Disp: , Rfl:    rosuvastatin  (CRESTOR ) 20 MG tablet, Take 20 mg by mouth at bedtime., Disp: , Rfl:    sodium chloride  HYPERTONIC 3 % nebulizer solution, Take by nebulization in the morning and at bedtime. Use duonebs (through nebulizer) before using this, Disp: 750 mL, Rfl: 12   triamcinolone  (NASACORT ) 55 MCG/ACT AERO nasal inhaler, Place 1 spray into the nose daily., Disp: 49.5 g, Rfl: 1   valACYclovir (VALTREX) 1000 MG tablet, Take 1,000 mg by mouth 3 (three) times daily., Disp: , Rfl:   "

## 2024-03-04 ENCOUNTER — Ambulatory Visit (INDEPENDENT_AMBULATORY_CARE_PROVIDER_SITE_OTHER): Payer: 59 | Admitting: Otolaryngology

## 2024-03-05 ENCOUNTER — Ambulatory Visit: Payer: Self-pay

## 2024-06-12 ENCOUNTER — Ambulatory Visit: Payer: 59
